# Patient Record
Sex: Male | Born: 1970 | Race: White | Hispanic: No | Marital: Married | Smoking: Former smoker
Health system: Southern US, Community
[De-identification: ages and names within clinical notes are randomized; demographics above are authoritative.]

## PROBLEM LIST (undated history)

## (undated) DIAGNOSIS — K219 Gastro-esophageal reflux disease without esophagitis: Secondary | ICD-10-CM

## (undated) DIAGNOSIS — I10 Essential (primary) hypertension: Secondary | ICD-10-CM

## (undated) DIAGNOSIS — E785 Hyperlipidemia, unspecified: Secondary | ICD-10-CM

## (undated) DIAGNOSIS — R739 Hyperglycemia, unspecified: Secondary | ICD-10-CM

## (undated) DIAGNOSIS — T8859XA Other complications of anesthesia, initial encounter: Secondary | ICD-10-CM

## (undated) DIAGNOSIS — G4730 Sleep apnea, unspecified: Secondary | ICD-10-CM

## (undated) DIAGNOSIS — J301 Allergic rhinitis due to pollen: Secondary | ICD-10-CM

## (undated) HISTORY — PX: REFRACTIVE SURGERY: SHX103

## (undated) HISTORY — PX: WRIST SURGERY: SHX841

## (undated) HISTORY — DX: Allergic rhinitis due to pollen: J30.1

## (undated) HISTORY — PX: EYE SURGERY: SHX253

## (undated) HISTORY — DX: Hyperglycemia, unspecified: R73.9

## (undated) HISTORY — DX: Essential (primary) hypertension: I10

## (undated) HISTORY — PX: HERNIA REPAIR: SHX51

## (undated) HISTORY — PX: NM RENAL LASIX (ARMC HX): HXRAD1213

## (undated) HISTORY — DX: Hyperlipidemia, unspecified: E78.5

## (undated) HISTORY — PX: TOE SURGERY: SHX1073

## (undated) HISTORY — PX: LASIK: SHX215

## (undated) HISTORY — PX: CATARACT EXTRACTION: SUR2

---

## 2007-05-26 ENCOUNTER — Ambulatory Visit: Payer: Self-pay | Admitting: Internal Medicine

## 2014-02-27 ENCOUNTER — Ambulatory Visit: Payer: Self-pay

## 2014-04-11 ENCOUNTER — Ambulatory Visit: Payer: Self-pay | Admitting: Ophthalmology

## 2014-04-11 DIAGNOSIS — I10 Essential (primary) hypertension: Secondary | ICD-10-CM

## 2014-04-11 LAB — POTASSIUM: Potassium: 3.7 mmol/L (ref 3.5–5.1)

## 2014-04-25 ENCOUNTER — Ambulatory Visit: Payer: Self-pay | Admitting: Ophthalmology

## 2014-11-15 ENCOUNTER — Encounter: Payer: Self-pay | Admitting: Podiatry

## 2014-11-15 ENCOUNTER — Ambulatory Visit (INDEPENDENT_AMBULATORY_CARE_PROVIDER_SITE_OTHER): Payer: BLUE CROSS/BLUE SHIELD

## 2014-11-15 ENCOUNTER — Ambulatory Visit (INDEPENDENT_AMBULATORY_CARE_PROVIDER_SITE_OTHER): Payer: BLUE CROSS/BLUE SHIELD | Admitting: Podiatry

## 2014-11-15 ENCOUNTER — Other Ambulatory Visit: Payer: Self-pay | Admitting: Podiatry

## 2014-11-15 VITALS — BP 113/73 | HR 61 | Resp 16 | Ht 68.0 in | Wt 210.0 lb

## 2014-11-15 DIAGNOSIS — M722 Plantar fascial fibromatosis: Secondary | ICD-10-CM

## 2014-11-15 DIAGNOSIS — M8430XA Stress fracture, unspecified site, initial encounter for fracture: Secondary | ICD-10-CM | POA: Diagnosis not present

## 2014-11-15 NOTE — Patient Instructions (Signed)

## 2014-11-15 NOTE — Progress Notes (Signed)
   Subjective:    Patient ID: Mark Lucas, male    DOB: 01-22-1971, 44 y.o.   MRN: [id number] Pt states that his left heel has been hurting for 3/4 days and he has not try any treatment other than called and made an appt to see [facility].  HPI    Review of Systems  Musculoskeletal: Positive for gait problem.  All other systems reviewed and are negative.      Objective:   Physical Exam        Assessment & Plan:

## 2014-11-18 NOTE — Progress Notes (Signed)
Subjective:     Patient ID: Mark Lucas, male   DOB: 29-Dec-1970, 44 y.o.   MRN: [id number]  HPI patient presents with severe heel pain left and states that he cannot bear weight on his heel and it's been present for around 3 days. Does not remember specific injury   Review of Systems  All other systems reviewed and are negative.      Objective:   Physical Exam  Constitutional: He is oriented to person, place, and time.  Cardiovascular: Intact distal pulses.   Musculoskeletal: Normal range of motion.  Neurological: He is oriented to person, place, and time.  Skin: Skin is warm.  Nursing note and vitals reviewed.  neurovascular status intact muscle strength adequate with range of motion within normal limits. Patient's noted to have severe discomfort in the left heel mostly in the heel itself with mild to moderate pain also in the plantar heel. Patient states that it is to bear any weight currently on the heel     Assessment:     Probability for stress fracture of the left heel versus plantar fasciitis or other condition    Plan:     Reviewed condition and at this time placed into a air fracture walker to reduce all plantar pressure and advised him to wear this as much as possible for the next 4 weeks. Reviewed his x-rays and discussed that stress fracture the heel can take 12-16 weeks to heal and will have to keep monitoring. Reappoint one month or earlier if needed

## 2014-11-23 NOTE — Op Note (Signed)
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  PROCEDURE: Phacoemulsification with posterior chamber intraocular lens in the left eye, model SN60WF 19.5 diopter.  SURGEON: Lia HoppingNisha Pernella Ackerley, MD  INDICATIONS: This is a 44 year old male with a history of decreased vision in his left eye due to anterior polar cataract.  PROCEDURE: The risks and benefits of cataract surgery were discussed at length with the patient including bleeding, infection, retinal detachment, reoperation, diplopia, ptosis, loss of vision and loss of the eye. Informed consent was obtained. 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 It was encouraged that we still be allowed to use Betadine in the patient's eye prep prior to surgery, however, the patient refused any Betadine usage due to history of anaphylaxis. As such, the risks of proceeding with surgery without Betadine eye prep, including increased risk of infection were thoroughly discussed with the patient and the patient elected to proceed with surgery. On the day of surgery, he received several sets of preoperative medication in the left eye, including 0.5% tetracaine, 1% cyclopentolate, 10% phenylephrine, 0.5% ketorolac, 0.5% gatifloxacin, and 2% lidocaine. He was taken to the operating room and sedated by IV sedation. Topical tetracaine was placed in the eye. 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 The eye was rinsed with sterile saline. He was covered in sterile drapes leaving only the operative eye exposed. A Lieberman lid speculum was placed to provide exposure. Using 0.12 forceps and a sideport blade, a paracentesis was created. Then, a mixture of BSS  preservative-free lidocaine and epinephrine was injected into the anterior chamber. Next, a 2.4 mm keratome blade was used to create a 2-step full-thickness clear corneal incision temporally. The cystotome and Utrata forceps were used to create a continuous capsulorrhexis in the anterior lens capsule. BSS on a hydrodissection cannula was used to perform gentle hydrodissection. Phacoemulsification was then performed to remove the nucleus. Irrigation and aspiration was performed to remove the remaining cortical material. Provisc was injected to fill the capsular bag and anterior chamber. A 19.5 diopter SN60WF intraocular lens was injected into the capsular bag. The Conner wand was used to rotate it into the proper position in the capsular bag. Irrigation and aspiration was performed to remove the remaining viscoelastic material from the eye. BSS on a 30 gauge cannula was used to hydrate the wound. Intracameral antibiotics were administered. The wounds were checked and found to be water tight. The lid speculum and drapes were carefully removed. Several drops of Vigamox were placed in the operative eye. The eye was covered with protective eyewear. The patient was taken to the recovery area in good condition. There were no complications.    ____________________________ Lia HoppingNisha Tavarion Babington, MD nm:TT D: 04/25/2014 13:34:37 ET T: 04/25/2014 14:51:40 ET JOB#: [id number]  cc: Lia HoppingNisha Suleika Donavan, MD, <Dictator> Lia HoppingNISHA Ambera Fedele MD ELECTRONICALLY SIGNED 04/29/2014 13:53

## 2015-03-10 ENCOUNTER — Encounter: Payer: Self-pay | Admitting: Emergency Medicine

## 2015-03-10 ENCOUNTER — Ambulatory Visit
Admission: EM | Admit: 2015-03-10 | Discharge: 2015-03-10 | Disposition: A | Discharge status: Home / Self Care | Payer: BLUE CROSS/BLUE SHIELD | Attending: Family Medicine | Admitting: Family Medicine

## 2015-03-10 DIAGNOSIS — Z79899 Other long term (current) drug therapy: Secondary | ICD-10-CM | POA: Diagnosis not present

## 2015-03-10 DIAGNOSIS — E785 Hyperlipidemia, unspecified: Secondary | ICD-10-CM | POA: Diagnosis not present

## 2015-03-10 DIAGNOSIS — I10 Essential (primary) hypertension: Secondary | ICD-10-CM | POA: Insufficient documentation

## 2015-03-10 DIAGNOSIS — K219 Gastro-esophageal reflux disease without esophagitis: Secondary | ICD-10-CM | POA: Insufficient documentation

## 2015-03-10 DIAGNOSIS — Z87891 Personal history of nicotine dependence: Secondary | ICD-10-CM | POA: Insufficient documentation

## 2015-03-10 DIAGNOSIS — R197 Diarrhea, unspecified: Secondary | ICD-10-CM | POA: Diagnosis not present

## 2015-03-10 HISTORY — DX: Gastro-esophageal reflux disease without esophagitis: K21.9

## 2015-03-10 HISTORY — DX: Essential (primary) hypertension: I10

## 2015-03-10 HISTORY — DX: Hyperlipidemia, unspecified: E78.5

## 2015-03-10 LAB — CBC WITH DIFFERENTIAL/PLATELET
Basophils Absolute: 0 10*3/uL (ref 0–0.1)
Basophils Relative: 1 %
Eosinophils Absolute: 0.2 10*3/uL (ref 0–0.7)
Eosinophils Relative: 2 %
HEMATOCRIT: 45.9 % (ref 40.0–52.0)
Hemoglobin: 15.7 g/dL (ref 13.0–18.0)
LYMPHS ABS: 1.5 10*3/uL (ref 1.0–3.6)
Lymphocytes Relative: 16 %
MCH: 30.6 pg (ref 26.0–34.0)
MCHC: 34.3 g/dL (ref 32.0–36.0)
MCV: 89.2 fL (ref 80.0–100.0)
MONOS PCT: 11 %
Monocytes Absolute: 1 10*3/uL (ref 0.2–1.0)
NEUTROS PCT: 70 %
Neutro Abs: 6.7 10*3/uL — ABNORMAL HIGH (ref 1.4–6.5)
Platelets: 420 10*3/uL (ref 150–440)
RBC: 5.14 MIL/uL (ref 4.40–5.90)
RDW: 13.3 % (ref 11.5–14.5)
WBC: 9.4 10*3/uL (ref 3.8–10.6)

## 2015-03-10 LAB — COMPREHENSIVE METABOLIC PANEL
ALK PHOS: 97 U/L (ref 38–126)
ALT: 22 U/L (ref 17–63)
AST: 18 U/L (ref 15–41)
Albumin: 4.5 g/dL (ref 3.5–5.0)
Anion gap: 7 (ref 5–15)
BUN: 17 mg/dL (ref 6–20)
CO2: 30 mmol/L (ref 22–32)
CREATININE: 0.96 mg/dL (ref 0.61–1.24)
Calcium: 9.4 mg/dL (ref 8.9–10.3)
Chloride: 101 mmol/L (ref 101–111)
GFR calc Af Amer: >60 mL/min (ref >60)
GFR calc non Af Amer: >60 mL/min (ref >60)
GLUCOSE: 114 mg/dL — AB (ref 65–99)
Potassium: 3.6 mmol/L (ref 3.5–5.1)
Sodium: 138 mmol/L (ref 135–145)
Total Bilirubin: 0.4 mg/dL (ref 0.3–1.2)
Total Protein: 8.4 g/dL — ABNORMAL HIGH (ref 6.5–8.1)

## 2015-03-10 NOTE — ED Notes (Signed)
Diarrhea on and off for 1 month

## 2015-03-10 NOTE — ED Provider Notes (Signed)
CSN: [id number]     Arrival date & time 03/10/15  1645 History   First MD Initiated Contact with Patient 03/10/15 1707     Chief Complaint  Patient presents with  . Diarrhea   (Consider location/radiation/quality/duration/timing/severity/associated sxs/prior Treatment) HPI Comments: 44 yo male with a 3 days h/o watery diarrhea. Patient states has had watery diarrhea for the past 1-2 months but only occurs on the weekends. States this episode started Friday night (3 days ago), associated with mild abdominal cramping.  Denies fevers, chills, vomiting, hematochezia, melena, recent travel, sick contacts, new medications.   Patient is a 44 y.o. male presenting with diarrhea. The history is provided by the patient.  Diarrhea   Past Medical History  Diagnosis Date  . Hypertension   . Hyperlipemia   . GERD (gastroesophageal reflux disease)    Past Surgical History  Procedure Laterality Date  . Lasik    . Cataract extraction     No family history on file. History  Substance Use Topics  . Smoking status: Former Games developer  . Smokeless tobacco: Former Neurosurgeon  . Alcohol Use: 0.0 oz/week    0 Standard drinks or equivalent per week    Review of Systems  Gastrointestinal: Positive for diarrhea.    Allergies  Iodinated diagnostic agents  Home Medications   Prior to Admission medications   Medication Sig Start Date End Date Taking? Authorizing Provider  bisoprolol-hydrochlorothiazide (ZIAC) 2.5-6.25 MG per tablet Take 1 tablet by mouth daily.   Yes Historical Provider, MD  cimetidine (TAGAMET) 200 MG tablet Take 200 mg by mouth 2 (two) times daily.   Yes Historical Provider, MD  fexofenadine (ALLEGRA) 180 MG tablet Take 180 mg by mouth daily.   Yes Historical Provider, MD  lisinopril (PRINIVIL,ZESTRIL) 20 MG tablet Take 20 mg by mouth daily.   Yes Historical Provider, MD  lovastatin (MEVACOR) 20 MG tablet Take 20 mg by mouth at bedtime.   Yes Historical Provider, MD  omeprazole (PRILOSEC)  20 MG capsule Take 20 mg by mouth daily.   Yes Historical Provider, MD   BP 100/65 mmHg  Pulse 70  Temp(Src) 97.8 F (36.6 C) (Tympanic)  Resp 18  Ht  (1.727 m)  Wt 215 lb (97.523 kg)  BMI 32.70 kg/m2  SpO2 97% Physical Exam  Constitutional: He appears well-developed and well-nourished. No distress.  Cardiovascular: Normal rate, regular rhythm and normal heart sounds.   Pulmonary/Chest: Effort normal and breath sounds normal. No respiratory distress. He has no wheezes. He has no rales.  Abdominal: Soft. Bowel sounds are normal. He exhibits no distension and no mass. There is no tenderness. There is no rebound and no guarding.  Neurological: He is alert.  Skin: No rash noted. He is not diaphoretic.  Nursing note and vitals reviewed.   ED Course  Procedures (including critical care time) Labs Review Labs Reviewed  CBC WITH DIFFERENTIAL/PLATELET - Abnormal; Notable for the following:    Neutro Abs 6.7 (*)    All other components within normal limits  COMPREHENSIVE METABOLIC PANEL - Abnormal; Notable for the following:    Glucose, Bld 114 (*)    Total Protein 8.4 (*)    All other components within normal limits  STOOL CULTURE  OVA AND PARASITE EXAMINATION  C DIFFICILE QUICK SCAN W PCR REFLEX    Imaging Review No results found.   MDM   1. Diarrhea   (unknown etiology) Plan: 1. Test results (negative/normal CBC, CMP)  and possible etiologies reviewed with patient  2. Check stool studies; patient unable to go in clinic; given sample containers 3. Recommend supportive treatment with clear liquid diet then advance slowly as tolerated; otc probiotics, imodium prn 4. F/u prn if symptoms worsen or don't improve    Payton Mccallum, MD 03/10/15 [phone]

## 2015-03-11 ENCOUNTER — Telehealth: Payer: Self-pay | Admitting: Family Medicine

## 2015-03-11 LAB — C DIFFICILE QUICK SCREEN W PCR REFLEX
C DIFFICILE (CDIFF) INTERP: NEGATIVE
C Diff antigen: NEGATIVE
C Diff toxin: NEGATIVE

## 2015-03-14 LAB — STOOL CULTURE: Special Requests: NORMAL

## 2015-03-27 ENCOUNTER — Telehealth: Payer: Self-pay | Admitting: Family Medicine

## 2015-03-27 LAB — OVA AND PARASITE EXAMINATION: Special Requests: NORMAL

## 2015-03-27 NOTE — Telephone Encounter (Signed)
Pt's wife called concerned because pt's bp is registering at 190/116. Please call ASAP. Thanks

## 2015-03-27 NOTE — Telephone Encounter (Signed)
I spoke back with his wife, his BP was still high at lunch time. She states he refuses to go to the ER. She states that he is still at work, but there is a Conservation officer, historic buildings there that is keeping a check on him. I asked her to please try to at least get him to go to Urgent Care and get started on meds today. She said she would attempt to, but he's "stubborn". We did go ahead and scheduled him an appointment with Dr. Sanda Klein for Tuesday.

## 2015-03-27 NOTE — Telephone Encounter (Signed)
FYI, Pt's PP number is [id number]. Last seen in Jan.

## 2015-03-27 NOTE — Telephone Encounter (Signed)
Patient had a metabolic test done at wife's work yesterday. It was super high there, and she checked it at home and it was 190/116. She tried telling him he needed to go to the ER but he was refusing. He says he has no headache, chest pain, SOB, dizziness. She was going to recheck it now and I advised her if it is still very high to take him to the ER.  She also wanted to know that if he still refuses to go to the ER, she had read if he drank a lot of water and took a diuretic that would drop it. Is that true? And/or safe to do?

## 2015-03-27 NOTE — Telephone Encounter (Signed)
I reviewed Programme researcher, broadcasting/film/video; I saw him January 28th, started him on BP medicine and he was supposed to return for f/u 2-3 weeks later I am going to have to advise him to go to urgent care today and then schedule an appt to see me in the next week We need to see him regularly to be able to help him and get his pressure controlled

## 2015-03-29 ENCOUNTER — Other Ambulatory Visit: Payer: Self-pay

## 2015-03-29 ENCOUNTER — Encounter: Payer: Self-pay | Admitting: Emergency Medicine

## 2015-03-29 ENCOUNTER — Emergency Department
Admission: EM | Admit: 2015-03-29 | Discharge: 2015-03-29 | Disposition: A | Discharge status: Home / Self Care | Payer: 59 | Attending: Emergency Medicine | Admitting: Emergency Medicine

## 2015-03-29 DIAGNOSIS — Z79899 Other long term (current) drug therapy: Secondary | ICD-10-CM | POA: Insufficient documentation

## 2015-03-29 DIAGNOSIS — I10 Essential (primary) hypertension: Secondary | ICD-10-CM | POA: Insufficient documentation

## 2015-03-29 MED ORDER — HYDROCHLOROTHIAZIDE 25 MG PO TABS
25.0000 mg | ORAL_TABLET | Freq: Every day | ORAL | Status: DC
  Administered 2015-03-29: 25 mg via ORAL
  Filled 2015-03-29: qty 1

## 2015-03-29 MED ORDER — HYDROCHLOROTHIAZIDE 25 MG PO TABS: 25.0000 mg | ORAL_TABLET | Freq: Every day | ORAL | Status: DC

## 2015-03-29 NOTE — ED Provider Notes (Signed)
[facility] Emergency Department Provider Note  Time seen: 2:38 PM  I have reviewed the triage vital signs and the nursing notes.   HISTORY  Chief Complaint Hypertension    HPI Andrew Bond is a 44 y.o. male with no past medical history presents the emergency department for an elevated blood pressure. According to the patient and his wife, they had a life insurance assessment, and his blood pressure was consistently elevated in the 180s. She checked it later that day and it was in the 190s, they checked it again today and was in the 170s so they came to the emergency department for evaluation. Patient denies any symptoms, denies any chest pain, headache, focal weakness or numbness. Patient states he only came because his wife wanted him to get evaluated.     History reviewed. No pertinent past medical history.  There are no active problems to display for this patient.   Past Surgical History  Procedure Laterality Date  . Refractive surgery    . Toe surgery    . Wrist surgery    . Hernia repair      x6    Current Outpatient Rx  Name  Route  Sig  Dispense  Refill  . loratadine (CLARITIN) 10 MG tablet   Oral   Take 10 mg by mouth daily.           Allergies Codeine  No family history on file.  Social History Social History  Substance Use Topics  . Smoking status: Never Smoker   . Smokeless tobacco: None  . Alcohol Use: Yes     Comment: occasionally    Review of Systems Constitutional: Negative for fever Cardiovascular: Negative for chest pain. Respiratory: Negative for shortness of breath. Gastrointestinal: Negative for abdominal pain Neurological: Negative for headache 10-point ROS otherwise negative.  ____________________________________________   PHYSICAL EXAM:  VITAL SIGNS: ED Triage Vitals  Enc Vitals Group     BP 03/29/15 1349 178/107 mmHg     Pulse Rate 03/29/15 1349 85     Resp 03/29/15 1349 20   Temp 03/29/15 1349 98.2 F (36.8 C)     Temp Source 03/29/15 1349 Oral     SpO2 03/29/15 1349 99 %     Weight 03/29/15 1349 180 lb (81.647 kg)     Height 03/29/15 1349 5\' 8"  (1.727 m)     Head Cir --      Peak Flow --      Pain Score 03/29/15 1350 0     Pain Loc --      Pain Edu? --      Excl. in [place]? --     Constitutional: Alert and oriented. Well appearing and in no distress. Eyes: Normal exam ENT   Head: Normocephalic and atraumatic. Cardiovascular: Normal rate, regular rhythm. No murmurs, rubs, or gallops. Respiratory: Normal respiratory effort without tachypnea nor retractions. Breath sounds are clear and equal bilaterally. No wheezes/rales/rhonchi. Gastrointestinal: Soft and nontender. No distention Musculoskeletal: Nontender with normal range of motion in all extremities Neurologic:  Normal speech and language. No gross focal neurologic deficits  Skin:  Skin is warm, dry and intact.  Psychiatric: Mood and affect are normal. Speech and behavior are normal.  ____________________________________________    EKG  EKG reviewed and interpreted by myself shows normal sinus rhythm at 79 bpm, narrow QRS, normal axis, normal intervals, no ST changes present. Overall normal EKG.  ____________________________________________   INITIAL IMPRESSION / ASSESSMENT AND PLAN / ED  COURSE  Pertinent labs & imaging results that were available during my care of the patient were reviewed by me and considered in my medical decision making (see chart for details).  Patient with elevated blood pressure 178/107 in the emergency department. Patient is otherwise asymptomatic. Patient states at one time several years ago he was prescribed a blood pressure medication, but states that blood pressure returned to normal and he stopped taking the pills. We will start the patient on hydrochlorothiazide, he has a follow-up appointment this Tuesday with a primary care doctor. Discussed return precautions for  chest pain, headache, focal weakness or numbness, patient agreeable. We'll discharge home at this time.  ____________________________________________   FINAL CLINICAL IMPRESSION(S) / ED DIAGNOSES  Hypertension   Harvest Dark, MD 03/29/15 1451

## 2015-03-29 NOTE — Discharge Instructions (Signed)

## 2015-03-29 NOTE — ED Notes (Signed)
Patient presents to the ED with hypertension for several days.  Patient had a health assessment for insurance and patient's systolic bp was in the 416L.  Patient denies dizziness, weakness, or headache.  Patient has been checking his bp daily since his assessment and it has been high, 845X-646O diastolic.  Patient has a pcp appointment Tuesday.

## 2015-03-31 DIAGNOSIS — R030 Elevated blood-pressure reading, without diagnosis of hypertension: Secondary | ICD-10-CM

## 2015-03-31 DIAGNOSIS — IMO0001 Reserved for inherently not codable concepts without codable children: Secondary | ICD-10-CM | POA: Insufficient documentation

## 2015-03-31 DIAGNOSIS — Z720 Tobacco use: Secondary | ICD-10-CM | POA: Insufficient documentation

## 2015-03-31 LAB — OVA + PARASITE EXAM

## 2015-04-01 ENCOUNTER — Ambulatory Visit (INDEPENDENT_AMBULATORY_CARE_PROVIDER_SITE_OTHER): Payer: 59 | Admitting: Family Medicine

## 2015-04-01 ENCOUNTER — Encounter: Payer: Self-pay | Admitting: Family Medicine

## 2015-04-01 VITALS — BP 158/108 | HR 79 | Temp 97.6°F | Ht 67.0 in | Wt 187.0 lb

## 2015-04-01 DIAGNOSIS — I10 Essential (primary) hypertension: Secondary | ICD-10-CM | POA: Diagnosis not present

## 2015-04-01 DIAGNOSIS — J301 Allergic rhinitis due to pollen: Secondary | ICD-10-CM | POA: Diagnosis not present

## 2015-04-01 DIAGNOSIS — Z0000 Encounter for general adult medical examination without abnormal findings: Secondary | ICD-10-CM

## 2015-04-01 HISTORY — DX: Allergic rhinitis due to pollen: J30.1

## 2015-04-01 HISTORY — DX: Essential (primary) hypertension: I10

## 2015-04-01 MED ORDER — AMLODIPINE BESYLATE 2.5 MG PO TABS
2.5000 mg | ORAL_TABLET | Freq: Every day | ORAL | Status: DC
Start: 2015-04-01 — End: 2015-04-22

## 2015-04-01 NOTE — Assessment & Plan Note (Signed)
I wanted to get lipids and glucose today but patient says he just had full panel done through [facility] last week, will bring those in today; declined lab draw today

## 2015-04-01 NOTE — Progress Notes (Signed)
BP 158/108 mmHg  Pulse 79  Temp(Src) 97.6 F (36.4 C)  Ht 5\' 7"  (1.702 m)  Wt 187 lb (84.823 kg)  BMI 29.28 kg/m2  SpO2 97%   Subjective:    Patient ID: Andrew Bond, male    DOB: 12/10/1970, 44 y.o.   MRN: [id number]  HPI: Andrew Bond is a 44 y.o. male  Chief Complaint  Patient presents with  . Hypertension   He went to urgent care one day last week and it was back down so they let him go without any medicine, 140/90 or so The next day, went up and went to the ER; they put him on HCTZ; he has not been checking BP at home much 120/90 at home; always been 80s and 90s Under fair amount of stress at work; nonsmoker; no decongestants; adds a little salt, but not much; eats sandwich at the house, not much fast food  Relevant past medical, surgical, family and social history reviewed and updated as indicated. Mother did have HTN, brother has HTN (twin). Interim medical history since our last visit reviewed. Allergies and medications reviewed and updated.  Review of Systems  Constitutional: Negative for unexpected weight change.  Respiratory: Negative for shortness of breath.   Cardiovascular: Negative for chest pain, palpitations and leg swelling.  Gastrointestinal: Negative for blood in stool.  Genitourinary: Negative for hematuria.  Per HPI unless specifically indicated above     Objective:    BP 158/108 mmHg  Pulse 79  Temp(Src) 97.6 F (36.4 C)  Ht 5\' 7"  (1.702 m)  Wt 187 lb (84.823 kg)  BMI 29.28 kg/m2  SpO2 97%  Wt Readings from Last 3 Encounters:  04/01/15 187 lb (84.823 kg)  08/29/14 187 lb (84.823 kg)  03/29/15 180 lb (81.647 kg)    Physical Exam  Constitutional: He appears well-developed and well-nourished. No distress.  HENT:  Head: Normocephalic and atraumatic.  Eyes: EOM are normal. No scleral icterus.  Neck: No thyromegaly present.  Cardiovascular: Normal rate and regular rhythm.   Pulmonary/Chest: Effort normal and  breath sounds normal.  Abdominal: Soft. Bowel sounds are normal. He exhibits no distension.  Musculoskeletal: He exhibits no edema.  Neurological: Coordination normal.  Skin: Skin is warm and dry. No pallor.  Psychiatric: He has a normal mood and affect. His behavior is normal. Judgment and thought content normal.   No results found for this or any previous visit.    Assessment & Plan:   Problem List Items Addressed This Visit      Cardiovascular and Mediastinum   Essential hypertension, benign - Primary    DASH guidelines encouraged; avoid decongestants; discussed stroke risk; continue HCTZ; add CCB; he just had labs done a week ago and will bring those results to me; explained role of kidneys in BP control; monitor BP at home; decrease sodium; nonsmoker; limit stress; return in 3 weeks and get BMP at that visit      Relevant Medications   amLODipine (NORVASC) 2.5 MG tablet     Respiratory   Allergic rhinitis due to pollen    Avoid decongestants with HTN; okay to plain antihistamines        Other   Preventative health care    I wanted to get lipids and glucose today but patient says he just had full panel done through [facility] last week, will bring those in today; declined lab draw today         Follow up plan: Return  in about 3 weeks (around 04/22/2015) for blood pressure.  An after-visit summary was printed and given to the patient at [facility].  Please see the patient instructions which may contain other information and recommendations beyond what is mentioned above in the assessment and plan. Face-to-face time with patient was more than 25 minutes, >50% time spent counseling and coordination of care Meds ordered this encounter  Medications  . amLODipine (NORVASC) 2.5 MG tablet    Sig: Take 1 tablet (2.5 mg total) by mouth daily.    Dispense:  30 tablet    Refill:  6

## 2015-04-01 NOTE — Patient Instructions (Addendum)
Try to use PLAIN allergy medicine without the decongestant Avoid: phenylephrine, phenylpropanolamine, and pseudoephredine Start the new blood pressure amlodipine in addition to the HCTZ Please do get those lab results to me in the next week Return in 3 weeks for blood pressure and BMP lab If you notice leg cramps or heart palpitations in the meantime not helped by a banana or glass of orange juice, then call and come in sooner   DASH Eating Plan DASH stands for "Dietary Approaches to Stop Hypertension." The DASH eating plan is a healthy eating plan that has been shown to reduce high blood pressure (hypertension). Additional health benefits may include reducing the risk of type 2 diabetes mellitus, heart disease, and stroke. The DASH eating plan may also help with weight loss. WHAT DO I NEED TO KNOW ABOUT THE DASH EATING PLAN? For the DASH eating plan, you will follow these general guidelines:  Choose foods with a percent daily value for sodium of less than 5% (as listed on the food label).  Use salt-free seasonings or herbs instead of table salt or sea salt.  Check with your health care provider or pharmacist before using salt substitutes.  Eat lower-sodium products, often labeled as "lower sodium" or "no salt added."  Eat fresh foods.  Eat more vegetables, fruits, and low-fat dairy products.  Choose whole grains. Look for the word "whole" as the first word in the ingredient list.  Choose fish and skinless chicken or [place] more often than red meat. Limit fish, poultry, and meat to 6 oz (170 g) each day.  Limit sweets, desserts, sugars, and sugary drinks.  Choose heart-healthy fats.  Limit cheese to 1 oz (28 g) per day.  Eat more home-cooked food and less restaurant, buffet, and fast food.  Limit fried foods.  Cook foods using methods other than frying.  Limit canned vegetables. If you do use them, rinse them well to decrease the sodium.  When eating at a restaurant, ask  that your food be prepared with less salt, or no salt if possible. WHAT FOODS CAN I EAT? Seek help from a dietitian for individual calorie needs. Grains Whole grain or whole wheat bread. Brown rice. Whole grain or whole wheat pasta. Quinoa, bulgur, and whole grain cereals. Low-sodium cereals. Corn or whole wheat flour tortillas. Whole grain cornbread. Whole grain crackers. Low-sodium crackers. Vegetables Fresh or frozen vegetables (raw, steamed, roasted, or grilled). Low-sodium or reduced-sodium tomato and vegetable juices. Low-sodium or reduced-sodium tomato sauce and paste. Low-sodium or reduced-sodium canned vegetables.  Fruits All fresh, canned (in natural juice), or frozen fruits. Meat and Other Protein Products Ground beef (85% or leaner), grass-fed beef, or beef trimmed of fat. Skinless chicken or [place]. Ground chicken or [place]. Pork trimmed of fat. All fish and seafood. Eggs. Dried beans, peas, or lentils. Unsalted nuts and seeds. Unsalted canned beans. Dairy Low-fat dairy products, such as skim or 1% milk, 2% or reduced-fat cheeses, low-fat ricotta or cottage cheese, or plain low-fat yogurt. Low-sodium or reduced-sodium cheeses. Fats and Oils Tub margarines without trans fats. Light or reduced-fat mayonnaise and salad dressings (reduced sodium). Avocado. Safflower, olive, or canola oils. Natural peanut or almond butter. Other Unsalted popcorn and pretzels. The items listed above may not be a complete list of recommended foods or beverages. Contact your dietitian for more options. WHAT FOODS ARE NOT RECOMMENDED? Grains White bread. White pasta. White rice. Refined cornbread. Bagels and croissants. Crackers that contain trans fat. Vegetables Creamed or fried vegetables. Vegetables in a  cheese sauce. Regular canned vegetables. Regular canned tomato sauce and paste. Regular tomato and vegetable juices. Fruits Dried fruits. Canned fruit in light or heavy syrup. Fruit juice. Meat and  Other Protein Products Fatty cuts of meat. Ribs, chicken wings, bacon, sausage, bologna, salami, chitterlings, fatback, hot dogs, bratwurst, and packaged luncheon meats. Salted nuts and seeds. Canned beans with salt. Dairy Whole or 2% milk, cream, half-and-half, and cream cheese. Whole-fat or sweetened yogurt. Full-fat cheeses or blue cheese. Nondairy creamers and whipped toppings. Processed cheese, cheese spreads, or cheese curds. Condiments Onion and garlic salt, seasoned salt, table salt, and sea salt. Canned and packaged gravies. Worcestershire sauce. Tartar sauce. Barbecue sauce. Teriyaki sauce. Soy sauce, including reduced sodium. Steak sauce. Fish sauce. Oyster sauce. Cocktail sauce. Horseradish. Ketchup and mustard. Meat flavorings and tenderizers. Bouillon cubes. Hot sauce. Tabasco sauce. Marinades. Taco seasonings. Relishes. Fats and Oils Butter, stick margarine, lard, shortening, ghee, and bacon fat. Coconut, palm kernel, or palm oils. Regular salad dressings. Other Pickles and olives. Salted popcorn and pretzels. The items listed above may not be a complete list of foods and beverages to avoid. Contact your dietitian for more information. WHERE CAN I FIND MORE INFORMATION? National Heart, Lung, and Blood Institute: travelstabloid.com Document Released: 07/08/2011 Document Revised: 12/03/2013 Document Reviewed: 05/23/2013 [facility] Patient Information 2015 [place], [place]. This information is not intended to replace advice given to you by your health care provider. Make sure you discuss any questions you have with your health care provider.

## 2015-04-01 NOTE — Assessment & Plan Note (Signed)
Avoid decongestants with HTN; okay to plain antihistamines

## 2015-04-01 NOTE — Assessment & Plan Note (Addendum)
DASH guidelines encouraged; avoid decongestants; discussed stroke risk; continue HCTZ; add CCB; he just had labs done a week ago and will bring those results to me; explained role of kidneys in BP control; monitor BP at home; decrease sodium; nonsmoker; limit stress; return in 3 weeks and get BMP at that visit

## 2015-04-14 ENCOUNTER — Encounter: Payer: Self-pay | Admitting: Family Medicine

## 2015-04-22 ENCOUNTER — Ambulatory Visit (INDEPENDENT_AMBULATORY_CARE_PROVIDER_SITE_OTHER): Payer: 59 | Admitting: Family Medicine

## 2015-04-22 ENCOUNTER — Encounter: Payer: Self-pay | Admitting: Family Medicine

## 2015-04-22 VITALS — BP 146/93 | HR 67 | Temp 97.2°F | Wt 189.0 lb

## 2015-04-22 DIAGNOSIS — I10 Essential (primary) hypertension: Secondary | ICD-10-CM | POA: Diagnosis not present

## 2015-04-22 DIAGNOSIS — Z5181 Encounter for therapeutic drug level monitoring: Secondary | ICD-10-CM | POA: Insufficient documentation

## 2015-04-22 DIAGNOSIS — E785 Hyperlipidemia, unspecified: Secondary | ICD-10-CM

## 2015-04-22 HISTORY — DX: Hyperlipidemia, unspecified: E78.5

## 2015-04-22 MED ORDER — HYDROCHLOROTHIAZIDE 25 MG PO TABS: 25.0000 mg | ORAL_TABLET | Freq: Every day | ORAL | Status: DC

## 2015-04-22 MED ORDER — AMLODIPINE BESYLATE 5 MG PO TABS: 5.0000 mg | ORAL_TABLET | Freq: Every day | ORAL | Status: DC

## 2015-04-22 NOTE — Progress Notes (Signed)
BP 146/93 mmHg  Pulse 67  Temp(Src) 97.2 F (36.2 C)  Wt 189 lb (85.73 kg)  SpO2 97%   Subjective:    Patient ID: Andrew Bond, male    DOB: 1970/12/16, 44 y.o.   MRN: [id number]  HPI: Andrew Bond is a 44 y.o. male  Chief Complaint  Patient presents with  . Hypertension   He brought the BP monitor using at home and his runs higher than ours His numbers at home are ranging in the 35D diastolic  Trying to limit salt intake No side effects; no constipation, no puffiness around the ankles  He is trying to get motivated to exercise again; time is a struggle; he has equipment, that isn't a problem; by the time he gets home, he eats and then sits and relaxes  He had lipids done, sent to our office; LDL was elevated  Relevant past medical, surgical, family and social history reviewed and updated as indicated. Interim medical history since our last visit reviewed. Allergies and medications reviewed and updated.  Review of Systems Per HPI unless specifically indicated above     Objective:    BP 146/93 mmHg  Pulse 67  Temp(Src) 97.2 F (36.2 C)  Wt 189 lb (85.73 kg)  SpO2 97%  Wt Readings from Last 3 Encounters:  04/22/15 189 lb (85.73 kg)  04/01/15 187 lb (84.823 kg)  08/29/14 187 lb (84.823 kg)    Physical Exam  Constitutional: He appears well-developed and well-nourished.  Eyes: No scleral icterus.  Cardiovascular: Normal rate and regular rhythm.   Pulmonary/Chest: Effort normal and breath sounds normal.  Abdominal: He exhibits no distension.  Musculoskeletal: He exhibits no edema.  Skin: Skin is warm and dry.  Psychiatric: He has a normal mood and affect.    No results found for this or any previous visit.    Assessment & Plan:   Problem List Items Addressed This Visit      Cardiovascular and Mediastinum   Essential hypertension, benign - Primary    Better but not quite to goal, increase CCB from 2.5 mg to 5 mg and continue  the HCTZ; work on modest weight loss; DASH guidelines; call me in 2-3 weeks with numbers      Relevant Medications   amLODipine (NORVASC) 5 MG tablet   hydrochlorothiazide (HYDRODIURIL) 25 MG tablet   Other Relevant Orders   Basic Metabolic Panel (BMET)     Other   Medication monitoring encounter    Check BMP on thiazide diuretic      Relevant Orders   Basic Metabolic Panel (BMET)   Dyslipidemia    Limit eggs, saturated fats; increase fiber; modest weight loss; recheck fasting lipids at f/u in 5-6 months         Follow up plan: Return in about 5 years (around 04/21/2020) for fasting labs, follow-up appt.   Medications Discontinued During This Encounter  Medication Reason  . amLODipine (NORVASC) 2.5 MG tablet Dose change  . hydrochlorothiazide (HYDRODIURIL) 25 MG tablet Reorder    Meds ordered this encounter  Medications  . amLODipine (NORVASC) 5 MG tablet    Sig: Take 1 tablet (5 mg total) by mouth daily.    Dispense:  30 tablet    Refill:  6  . hydrochlorothiazide (HYDRODIURIL) 25 MG tablet    Sig: Take 1 tablet (25 mg total) by mouth daily.    Dispense:  30 tablet    Refill:  6   Orders Placed  This Encounter  Procedures  . Basic Metabolic Panel (BMET)

## 2015-04-22 NOTE — Assessment & Plan Note (Signed)
Better but not quite to goal, increase CCB from 2.5 mg to 5 mg and continue the HCTZ; work on modest weight loss; DASH guidelines; call me in 2-3 weeks with numbers

## 2015-04-22 NOTE — Assessment & Plan Note (Signed)
Limit eggs, saturated fats; increase fiber; modest weight loss; recheck fasting lipids at f/u in 5-6 months

## 2015-04-22 NOTE — Patient Instructions (Addendum)
Try to follow the DASH guidelines We'll check just a basic metabolic panel today Increase the amlodipine from 2.5 mg daily to 5 mg daily Shop around for the best price Check out the information at familydoctor.org entitled "What It Takes to Lose Weight" Try to lose between 1-2 pounds per week by taking in fewer calories and burning off more calories You can succeed by limiting portions, limiting foods dense in calories and fat, becoming more active, and drinking 8 glasses of water a day Don't skip meals, especially breakfast, as skipping meals may alter your metabolism Do not use over-the-counter weight loss pills or gimmicks that claim rapid weight loss A healthy BMI (or body mass index) is between 18.5 and 24.9 You can calculate your ideal BMI at the [place] website ClubMonetize.fr Call me in 2-3 weeks with your BP readings Return in 5-6 months, come fasting and we'll check your cholesterol at that time time Limit eggs to no more than 3 per weeks Return for a flu vaccine in October, and avoid hospitals, nursing homes, and daycares during peak flu and cold season  Cholesterol Cholesterol is a white, waxy, fat-like substance needed by your body in small amounts. The liver makes all the cholesterol you need. Cholesterol is carried from the liver by the blood through the blood vessels. Deposits of cholesterol (plaque) may build up on blood vessel walls. These make the arteries narrower and stiffer. Cholesterol plaques increase the risk for heart attack and stroke.  You cannot feel your cholesterol level even if it is very high. The only way to know it is high is with a blood test. Once you know your cholesterol levels, you should keep a record of the test results. Work with your health care provider to keep your levels in the desired range.  WHAT DO THE RESULTS MEAN?  Total cholesterol is a rough measure of all the cholesterol in your blood.   LDL  is the so-called bad cholesterol. This is the type that deposits cholesterol in the walls of the arteries. You want this level to be low.   HDL is the good cholesterol because it cleans the arteries and carries the LDL away. You want this level to be high.  Triglycerides are fat that the body can either burn for energy or store. High levels are closely linked to heart disease.  WHAT ARE THE DESIRED LEVELS OF CHOLESTEROL?  Total cholesterol below 200.   LDL below 100 for people at risk, below 70 for those at very high risk.   HDL above 50 is good, above 60 is best.   Triglycerides below 150.  HOW CAN I LOWER MY CHOLESTEROL?  Diet. Follow your diet programs as directed by your health care provider.   Choose fish or white meat chicken and [place], roasted or baked. Limit fatty cuts of red meat, fried foods, and processed meats, such as sausage and lunch meats.   Eat lots of fresh fruits and vegetables.  Choose whole grains, beans, pasta, potatoes, and cereals.   Use only small amounts of olive, corn, or canola oils.   Avoid butter, mayonnaise, shortening, or palm kernel oils.  Avoid foods with trans fats.   Drink skim or nonfat milk and eat low-fat or nonfat yogurt and cheeses. Avoid whole milk, cream, ice cream, egg yolks, and full-fat cheeses.   Healthy desserts include angel food cake, ginger snaps, animal crackers, hard candy, popsicles, and low-fat or nonfat frozen yogurt. Avoid pastries, cakes, pies, and cookies.  Exercise. Follow your exercise programs as directed by your health care provider.   A regular program helps decrease LDL and raise HDL.   A regular program helps with weight control.   Do things that increase your activity level like gardening, walking, or taking the stairs. Ask your health care provider about how you can be more active in your daily life.   Medicine. Take medicine only as directed by your health care provider.   Medicine may  be prescribed by your health care provider to help lower cholesterol and decrease the risk for heart disease.   If you have several risk factors, you may need medicine even if your levels are normal. Document Released: 04/13/2001 Document Revised: 12/03/2013 Document Reviewed: 05/02/2013 [facility] Patient Information 2015 [place], [place]. This information is not intended to replace advice given to you by your health care provider. Make sure you discuss any questions you have with your health care provider.

## 2015-04-22 NOTE — Assessment & Plan Note (Signed)
Check BMP on thiazide diuretic

## 2015-04-23 ENCOUNTER — Encounter: Payer: Self-pay | Admitting: Family Medicine

## 2015-04-23 LAB — BASIC METABOLIC PANEL
BUN / CREAT RATIO: 18 (ref 9–20)
BUN: 15 mg/dL (ref 6–24)
CO2: 23 mmol/L (ref 18–29)
CREATININE: 0.85 mg/dL (ref 0.76–1.27)
Calcium: 9.3 mg/dL (ref 8.7–10.2)
Chloride: 101 mmol/L (ref 97–108)
GFR calc non Af Amer: 107 mL/min/{1.73_m2} (ref >59)
GFR, EST AFRICAN AMERICAN: 123 mL/min/{1.73_m2} (ref >59)
GLUCOSE: 102 mg/dL — AB (ref 65–99)
Potassium: 3.7 mmol/L (ref 3.5–5.2)
Sodium: 140 mmol/L (ref 134–144)

## 2015-04-29 LAB — O&P RESULT

## 2015-05-07 ENCOUNTER — Telehealth: Payer: Self-pay

## 2015-05-07 NOTE — Telephone Encounter (Signed)
-----   Message from Arnetha Courser, MD sent at 04/22/2015  8:51 AM EDT ----- Regarding: flu shot Remind him to come in for a flu shot in october

## 2015-05-07 NOTE — Telephone Encounter (Signed)
Patient notified

## 2015-06-02 ENCOUNTER — Other Ambulatory Visit: Payer: Self-pay | Admitting: Family Medicine

## 2015-06-02 MED ORDER — AMLODIPINE BESYLATE 5 MG PO TABS: 5.0000 mg | ORAL_TABLET | Freq: Every day | ORAL | Status: DC

## 2015-06-02 MED ORDER — HYDROCHLOROTHIAZIDE 25 MG PO TABS: 25.0000 mg | ORAL_TABLET | Freq: Every day | ORAL | Status: DC

## 2015-06-02 NOTE — Telephone Encounter (Signed)
He needs printed out rx's, going to a new pharmacy but patient doesn't know which one yet.

## 2015-06-02 NOTE — Telephone Encounter (Signed)
Pt needs new rx's due to the change of pharmacy. He will pick up hard copy

## 2015-06-02 NOTE — Telephone Encounter (Signed)
That's fine I called and left message to cancel his remaining refills at the [facility]

## 2015-07-21 NOTE — ED Notes (Signed)
Entered in error  Payton Mccallumrlando Kelden Lavallee, MD 07/21/15 1134

## 2015-09-24 ENCOUNTER — Encounter: Payer: Self-pay | Admitting: Family Medicine

## 2015-09-24 ENCOUNTER — Ambulatory Visit (INDEPENDENT_AMBULATORY_CARE_PROVIDER_SITE_OTHER): Payer: 59 | Admitting: Family Medicine

## 2015-09-24 ENCOUNTER — Ambulatory Visit
Admission: RE | Admit: 2015-09-24 | Discharge: 2015-09-24 | Disposition: A | Discharge status: Home / Self Care | Payer: 59 | Source: Ambulatory Visit | Attending: Family Medicine | Admitting: Family Medicine

## 2015-09-24 VITALS — BP 147/89 | HR 92 | Temp 98.1°F | Ht 67.5 in | Wt 183.8 lb

## 2015-09-24 DIAGNOSIS — Z0000 Encounter for general adult medical examination without abnormal findings: Secondary | ICD-10-CM

## 2015-09-24 DIAGNOSIS — I10 Essential (primary) hypertension: Secondary | ICD-10-CM | POA: Diagnosis not present

## 2015-09-24 DIAGNOSIS — R05 Cough: Secondary | ICD-10-CM

## 2015-09-24 DIAGNOSIS — R053 Chronic cough: Secondary | ICD-10-CM | POA: Insufficient documentation

## 2015-09-24 DIAGNOSIS — E785 Hyperlipidemia, unspecified: Secondary | ICD-10-CM | POA: Diagnosis not present

## 2015-09-24 DIAGNOSIS — Z5181 Encounter for therapeutic drug level monitoring: Secondary | ICD-10-CM

## 2015-09-24 LAB — CBC WITH DIFFERENTIAL/PLATELET
HEMATOCRIT: 42.7 % (ref 37.5–51.0)
Hemoglobin: 15.2 g/dL (ref 12.6–17.7)
LYMPHS ABS: 0.8 10*3/uL (ref 0.7–3.1)
LYMPHS: 15 %
MCH: 31.3 pg (ref 26.6–33.0)
MCHC: 35.6 g/dL (ref 31.5–35.7)
MCV: 88 fL (ref 79–97)
MID (Absolute): 0.6 10*3/uL (ref 0.1–1.6)
MID: 13 %
NEUTROS PCT: 72 %
Neutrophils Absolute: 3.6 10*3/uL (ref 1.4–7.0)
Platelets: 307 10*3/uL (ref 150–379)
RBC: 4.85 x10E6/uL (ref 4.14–5.80)
RDW: 13.5 % (ref 12.3–15.4)
WBC: 5 10*3/uL (ref 3.4–10.8)

## 2015-09-24 MED ORDER — LEVOFLOXACIN 500 MG PO TABS
500.0000 mg | ORAL_TABLET | Freq: Every day | ORAL | Status: DC
Start: 2015-09-24 — End: 2016-03-12

## 2015-09-24 MED ORDER — HYDROCOD POLST-CPM POLST ER 10-8 MG/5ML PO SUER: 5.0000 mL | Freq: Two times a day (BID) | ORAL | Status: DC | PRN

## 2015-09-24 MED ORDER — HYDROCHLOROTHIAZIDE 25 MG PO TABS: 25.0000 mg | ORAL_TABLET | Freq: Every day | ORAL | Status: DC

## 2015-09-24 MED ORDER — AMLODIPINE BESYLATE 5 MG PO TABS: 5.0000 mg | ORAL_TABLET | Freq: Every day | ORAL | Status: DC

## 2015-09-24 NOTE — Assessment & Plan Note (Addendum)
Check lipids today (not truly fasting, had cereal)

## 2015-09-24 NOTE — Assessment & Plan Note (Signed)
In-house CBC showed normal white count, but >70% segs (trending toward left shift); will test quantiferon gold and get CXR today; explained I would expect his oxygen level to be higher; close f/u in two days; start levaquin; cough syrup if needed; if rash, stop; do NOT mix with alcohol or other controlled substances, see AVS for cautions; if getting worse, go to urgent care or ER; explained he is likely infectious and contagious, advised he stay out of work, could get other people sick which could be serious; avoid exposures to nursing homes, daycares, hospitals as visitors; patient agrees with plan

## 2015-09-24 NOTE — Patient Instructions (Addendum)
Go from here to [facility] to get a chest xray  You are most likely contagious, so wear a mask when out in public, and stay away from hospitals, nursing homes, and daycares as a visitor  Use the Tussionex cough syrup as directed if you need it; do NOT use any pain pills, nerve pills, sleeping pills, or any other sort of controlled substance while taking this cough medicine; also, do NOT drink any alcohol while using this; combing alcohol or other substances with this cough medicine could result in accidental overdose and death  Try vitamin C (orange juice if not diabetic or vitamin C tablets) and drink green tea to help your immune system during your illness Get plenty of rest and hydration  Return for follow-up on Friday to recheck your oxygen and check your progress on the new medicine  If you get worse, please call or go to urgent care or the emergency department

## 2015-09-24 NOTE — Assessment & Plan Note (Signed)
Check creatinine 

## 2015-09-24 NOTE — Assessment & Plan Note (Signed)
Not to goal, but patient is sick today; avoid decongestants; continue antihypertensives; follow DASH guidelines; close f/u in two days; refills of meds provided; check creatinine and -lytes on thiazide

## 2015-09-24 NOTE — Progress Notes (Signed)
BP 147/89 mmHg  Pulse 92  Temp(Src) 98.1 F (36.7 C)  Ht 5' 7.5" (1.715 m)  Wt 183 lb 12.8 oz (83.371 kg)  BMI 28.35 kg/m2  SpO2 93%   Subjective:    Patient ID: Andrew Bond, male    DOB: [birth year], 45 y.o.   MRN: [id number]  HPI: Andrew Bond is a 45 y.o. male  Chief Complaint  Patient presents with  . Follow-up    For Hypertension. Patient says that this has been going on for 3 x's month. Gets better w/ antibiotics from walk-in clinic and sickness comes back.   . Nasal Congestion  . Vomiting    From mucous drainage  . Headache  . Cough   Patient and his wife are here, both sick today No coughing up blood, no weight loss, having night sweats; throwing off blankets Going on for 2-3 months; little bit of brown but mostly clear or cream color No swollen lymph nodes Having sore throat, irritated and raw from coughing and drainage Using nasal spray Coughing jags to the point of almost vomiting Pertussis appears to be out of date Patient went to urgent care 3 weeks ago, treated with doxycycline and it did not help Tessalon perles not working at all, has prescription cough medicine at home, tastes gross; codeine works best for patient He has not had any exposure to immigrant, prison, or homeless population He has not had a chest xray  He has high blood pressure; taking two meds  Relevant past medical, surgical, family and social history reviewed and updated as indicated Interim medical history since our last visit reviewed. Nonsmoker, but around smokers at work  Allergies and medications reviewed and updated.  Review of Systems Per HPI unless specifically indicated above     Objective:    BP 147/89 mmHg  Pulse 92  Temp(Src) 98.1 F (36.7 C)  Ht 5' 7.5" (1.715 m)  Wt 183 lb 12.8 oz (83.371 kg)  BMI 28.35 kg/m2  SpO2 93%  Wt Readings from Last 3 Encounters:  09/24/15 183 lb 12.8 oz (83.371 kg)  04/22/15 189 lb (85.73 kg)  04/01/15  187 lb (84.823 kg)    Physical Exam  Constitutional: He appears well-developed and well-nourished. No distress.  Weight loss of six pounds since last visit  HENT:  Right Ear: Hearing, external ear and ear canal normal. No drainage. Tympanic membrane is not erythematous and not retracted.  Left Ear: Hearing, external ear and ear canal normal. No drainage. Tympanic membrane is retracted. Tympanic membrane is not erythematous.  Nose: Mucosal edema present. Right sinus exhibits maxillary sinus tenderness and frontal sinus tenderness. Left sinus exhibits maxillary sinus tenderness and frontal sinus tenderness.  Mouth/Throat: Mucous membranes are not pale and not dry. Uvula swelling present. Posterior oropharyngeal erythema (mild) present. No oropharyngeal exudate.  Cardiovascular: Normal rate and regular rhythm.   Pulmonary/Chest: No accessory muscle usage. No respiratory distress. He has no decreased breath sounds. He has no wheezes. He has rhonchi (scattered). He has no rales.  Abdominal: He exhibits no distension.  Musculoskeletal: He exhibits no edema.  Lymphadenopathy:       Head (right side): No posterior auricular and no occipital adenopathy present.       Head (left side): No posterior auricular and no occipital adenopathy present.    He has no cervical adenopathy.       Right cervical: No posterior cervical adenopathy present.      Left cervical: No posterior cervical  adenopathy present.       Right: No supraclavicular adenopathy present.       Left: No supraclavicular adenopathy present.  Neurological: He is alert.  Skin: Skin is warm and dry. No rash noted. No pallor.  Psychiatric: He has a normal mood and affect.    Results for orders placed or performed in visit on 0000000  Basic Metabolic Panel (BMET)  Result Value Ref Range   Glucose 102 (H) 65 - 99 mg/dL   BUN 15 6 - 24 mg/dL   Creatinine, Ser 0.85 0.76 - 1.27 mg/dL   GFR calc non Af Amer 107 >59 mL/min/1.73   GFR calc  Af Amer 123 >59 mL/min/1.73   BUN/Creatinine Ratio 18 9 - 20   Sodium 140 134 - 144 mmol/L   Potassium 3.7 3.5 - 5.2 mmol/L   Chloride 101 97 - 108 mmol/L   CO2 23 18 - 29 mmol/L   Calcium 9.3 8.7 - 10.2 mg/dL      Assessment & Plan:   Problem List Items Addressed This Visit      Cardiovascular and Mediastinum   Essential hypertension, benign    Not to goal, but patient is sick today; avoid decongestants; continue antihypertensives; follow DASH guidelines; close f/u in two days; refills of meds provided; check creatinine and -lytes on thiazide      Relevant Medications   hydrochlorothiazide (HYDRODIURIL) 25 MG tablet   amLODipine (NORVASC) 5 MG tablet     Other   Preventative health care    Check lipids      Relevant Orders   Lipid Panel w/o Chol/HDL Ratio   Medication monitoring encounter    Check creatinine      Relevant Orders   Comprehensive metabolic panel   Dyslipidemia    Check lipids today (not truly fasting, had cereal)      Chronic cough - Primary    In-house CBC showed normal white count, but >70% segs (trending toward left shift); will test quantiferon gold and get CXR today; explained I would expect his oxygen level to be higher; close f/u in two days; start levaquin; cough syrup if needed; if rash, stop; do NOT mix with alcohol or other controlled substances, see AVS for cautions; if getting worse, go to urgent care or ER; explained he is likely infectious and contagious, advised he stay out of work, could get other people sick which could be serious; avoid exposures to nursing homes, daycares, hospitals as visitors; patient agrees with plan      Relevant Orders   Quantiferon tb gold assay (blood)   CBC With Differential/Platelet   DG Chest 2 View (Completed)       Follow up plan: Return in about 2 days (around 09/26/2015) for cough recheck.  Meds ordered this encounter  Medications  . fluticasone (FLONASE) 50 MCG/ACT nasal spray    Sig: Place into  both nostrils daily.  . chlorpheniramine-HYDROcodone (TUSSIONEX PENNKINETIC ER) 10-8 MG/5ML SUER    Sig: Take 5 mLs by mouth every 12 (twelve) hours as needed for cough.    Dispense:  115 mL    Refill:  0  . levofloxacin (LEVAQUIN) 500 MG tablet    Sig: Take 1 tablet (500 mg total) by mouth daily.    Dispense:  7 tablet    Refill:  0  . hydrochlorothiazide (HYDRODIURIL) 25 MG tablet    Sig: Take 1 tablet (25 mg total) by mouth daily.    Dispense:  30 tablet  Refill:  5  . amLODipine (NORVASC) 5 MG tablet    Sig: Take 1 tablet (5 mg total) by mouth daily.    Dispense:  30 tablet    Refill:  5   Orders Placed This Encounter  Procedures  . DG Chest 2 View  . Quantiferon tb gold assay (blood)  . CBC With Differential/Platelet  . Lipid Panel w/o Chol/HDL Ratio  . Comprehensive metabolic panel   An after-visit summary was printed and given to the patient at [facility].  Please see the patient instructions which may contain other information and recommendations beyond what is mentioned above in the assessment and plan.

## 2015-09-24 NOTE — Assessment & Plan Note (Signed)
Check lipids 

## 2015-09-25 LAB — COMPREHENSIVE METABOLIC PANEL
A/G RATIO: 1.8 (ref 1.1–2.5)
ALK PHOS: 95 IU/L (ref 39–117)
ALT: 17 IU/L (ref 0–44)
AST: 19 IU/L (ref 0–40)
Albumin: 4.2 g/dL (ref 3.5–5.5)
BUN/Creatinine Ratio: 13 (ref 9–20)
BUN: 12 mg/dL (ref 6–24)
Bilirubin Total: 0.3 mg/dL (ref 0.0–1.2)
CALCIUM: 9.4 mg/dL (ref 8.7–10.2)
CO2: 23 mmol/L (ref 18–29)
CREATININE: 0.94 mg/dL (ref 0.76–1.27)
Chloride: 98 mmol/L (ref 96–106)
GFR calc Af Amer: 114 mL/min/{1.73_m2} (ref >59)
GFR, EST NON AFRICAN AMERICAN: 98 mL/min/{1.73_m2} (ref >59)
Globulin, Total: 2.3 g/dL (ref 1.5–4.5)
Glucose: 106 mg/dL — ABNORMAL HIGH (ref 65–99)
POTASSIUM: 3.7 mmol/L (ref 3.5–5.2)
SODIUM: 139 mmol/L (ref 134–144)
Total Protein: 6.5 g/dL (ref 6.0–8.5)

## 2015-09-25 LAB — LIPID PANEL W/O CHOL/HDL RATIO
Cholesterol, Total: 222 mg/dL — ABNORMAL HIGH (ref 100–199)
HDL: 69 mg/dL (ref >39)
LDL CALC: 140 mg/dL — AB (ref 0–99)
Triglycerides: 65 mg/dL (ref 0–149)
VLDL CHOLESTEROL CAL: 13 mg/dL (ref 5–40)

## 2015-09-26 ENCOUNTER — Ambulatory Visit: Payer: 59 | Admitting: Family Medicine

## 2015-09-27 ENCOUNTER — Encounter: Payer: Self-pay | Admitting: Family Medicine

## 2015-09-27 LAB — QUANTIFERON TB GOLD ASSAY (BLOOD)

## 2015-09-27 LAB — QUANTIFERON IN TUBE
QFT TB AG MINUS NIL VALUE: 0.02 [IU]/mL
QUANTIFERON NIL VALUE: 0.17 [IU]/mL
QUANTIFERON TB AG VALUE: 0.19 [IU]/mL
QUANTIFERON TB GOLD: NEGATIVE

## 2015-10-07 ENCOUNTER — Other Ambulatory Visit: Payer: Self-pay | Admitting: Family Medicine

## 2015-10-07 NOTE — Telephone Encounter (Signed)
rxs approved 

## 2016-03-08 ENCOUNTER — Other Ambulatory Visit: Payer: Self-pay | Admitting: Family Medicine

## 2016-03-09 NOTE — Telephone Encounter (Signed)
I don't see any upcoming appointments; please verify with patient if he is going to see me here at [facility] (he's due for 6 month f/u) or if he'll be staying at [facility]; route refill to appropriate provider please; thank you

## 2016-03-09 NOTE — Telephone Encounter (Signed)
Left voice message for patient to return call. °

## 2016-03-12 ENCOUNTER — Telehealth: Payer: Self-pay | Admitting: Family Medicine

## 2016-03-12 ENCOUNTER — Other Ambulatory Visit: Payer: Self-pay

## 2016-03-12 MED ORDER — AMLODIPINE BESYLATE 5 MG PO TABS: 5.0000 mg | ORAL_TABLET | Freq: Every day | ORAL | 1 refills | Status: DC

## 2016-03-12 MED ORDER — HYDROCHLOROTHIAZIDE 25 MG PO TABS: 25.0000 mg | ORAL_TABLET | Freq: Every day | ORAL | 1 refills | Status: DC

## 2016-03-12 NOTE — Telephone Encounter (Signed)
lvm for pt to return call again, no response.

## 2016-03-12 NOTE — Telephone Encounter (Signed)
Patient is scheduled to see Dr. Sanda Klein on Fri. 04/16/16 @ 8am.  Please refill medication for enough until his appt.

## 2016-03-12 NOTE — Telephone Encounter (Signed)
Please see other phone note; contact patient to see if he is staying at [facility]; if so forward refill request to appropriate provider If he is going to see me, he'll need appt and then we'll refill after appt scheduled We just want to make sure he has appropriate follow-up and medicines are still correct dose Thank you

## 2016-03-12 NOTE — Telephone Encounter (Signed)
Thank you; we look forward to seeing him; Rxs sent

## 2016-03-16 NOTE — Telephone Encounter (Signed)
Patient has appt scheduled for 12-19-15

## 2016-03-16 NOTE — Telephone Encounter (Signed)
appt made

## 2016-04-16 ENCOUNTER — Encounter: Payer: Self-pay | Admitting: Family Medicine

## 2016-04-16 ENCOUNTER — Ambulatory Visit (INDEPENDENT_AMBULATORY_CARE_PROVIDER_SITE_OTHER): Payer: 59 | Admitting: Family Medicine

## 2016-04-16 VITALS — BP 138/80 | HR 90 | Temp 98.5°F | Wt 182.0 lb

## 2016-04-16 DIAGNOSIS — Z23 Encounter for immunization: Secondary | ICD-10-CM | POA: Diagnosis not present

## 2016-04-16 DIAGNOSIS — R739 Hyperglycemia, unspecified: Secondary | ICD-10-CM

## 2016-04-16 DIAGNOSIS — E785 Hyperlipidemia, unspecified: Secondary | ICD-10-CM | POA: Diagnosis not present

## 2016-04-16 DIAGNOSIS — I10 Essential (primary) hypertension: Secondary | ICD-10-CM | POA: Diagnosis not present

## 2016-04-16 DIAGNOSIS — Z5181 Encounter for therapeutic drug level monitoring: Secondary | ICD-10-CM

## 2016-04-16 DIAGNOSIS — J301 Allergic rhinitis due to pollen: Secondary | ICD-10-CM | POA: Diagnosis not present

## 2016-04-16 HISTORY — DX: Hyperglycemia, unspecified: R73.9

## 2016-04-16 NOTE — Patient Instructions (Signed)
Please do bring by those labs for review in the next few weeks If you can't find them, please have fasting labs done Your goal blood pressure is less than 140 mmHg on top. Try to follow the DASH guidelines (DASH stands for Dietary Approaches to Stop Hypertension) Try to limit the sodium in your diet.  Ideally, consume less than 1.5 grams (less than 1,500mg ) per day. Do not add salt when cooking or at the table.  Check the sodium amount on labels when shopping, and choose items lower in sodium when given a choice. Avoid or limit foods that already contain a lot of sodium. Eat a diet rich in fruits and vegetables and whole grains. You received the flu shot today; it should protect you against the flu virus over the coming months; it will take about two weeks for antibodies to develop; do try to stay away from hospitals, nursing homes, and daycares during peak flu season; taking extra vitamin C daily during flu season may help you avoid getting sick

## 2016-04-16 NOTE — Assessment & Plan Note (Addendum)
Nasal spray, singulair, plain claritin OR allergra, alternates; knows to avoid the decongestants

## 2016-04-16 NOTE — Assessment & Plan Note (Signed)
Check lipids today 

## 2016-04-16 NOTE — Assessment & Plan Note (Signed)
Check A1c. 

## 2016-04-16 NOTE — Assessment & Plan Note (Signed)
Continuing DASH guidelines; continue meds

## 2016-04-16 NOTE — Progress Notes (Signed)
BP 138/80   Pulse 90   Temp 98.5 F (36.9 C) (Oral)   Wt 182 lb (82.6 kg)   SpO2 98%   BMI 28.08 kg/m    Subjective:    Patient ID: Andrew Bond, male    DOB: [birth year], 45 y.o.   MRN: [id number]  HPI: Andrew Bond is a 45 y.o. male  Chief Complaint  Patient presents with  . Medication Refill   He is here for hypertension; BP is higher at the house No decongestants; not eating much salt; active at work; active at home too with chores HTN runs in the family  Allergic rhinitis; kicking up right now; avoiding decongestants; alternates plain claritin or plain allegra, not both; uses singulair and nasal spray too  GERD; using zantac PRN  Left knee flares up from time to time; naproxen PRN  Last glucose just over the cut-off in Feb; drinks diet sodas, does eat white bread  Depression screen PHQ 2/9 04/16/2016  Decreased Interest 0  Down, Depressed, Hopeless 0  PHQ - 2 Score 0   Relevant past medical, surgical, family and social history reviewed Past Medical History:  Diagnosis Date  . Allergic rhinitis due to pollen 04/01/2015  . Dyslipidemia 04/22/2015  . Essential hypertension, benign 04/01/2015  . Hyperglycemia 04/16/2016   Past Surgical History:  Procedure Laterality Date  . HERNIA REPAIR     x6  . NM RENAL LASIX ([place] HX)    . REFRACTIVE SURGERY    . TOE SURGERY    . WRIST SURGERY Bilateral    carpal tunnel   Family History  Problem Relation Age of Onset  . Hypertension Mother   . Heart disease Father   . Heart attack Father   . Hypertension Brother   . Cancer Maternal Grandmother     lymph nodes, spread to lungs   Social History  Substance Use Topics  . Smoking status: Former Smoker    Packs/day: 0.50    Years: 10.00    Types: Cigarettes    Quit date: 08/02/2014  . Smokeless tobacco: Never Used  . Alcohol use No   Interim medical history since last visit reviewed. Allergies and medications reviewed  Review of  Systems Per HPI unless specifically indicated above     Objective:    BP 138/80   Pulse 90   Temp 98.5 F (36.9 C) (Oral)   Wt 182 lb (82.6 kg)   SpO2 98%   BMI 28.08 kg/m   Wt Readings from Last 3 Encounters:  04/16/16 182 lb (82.6 kg)  09/24/15 183 lb 12.8 oz (83.4 kg)  04/22/15 189 lb (85.7 kg)    Physical Exam  Constitutional: He appears well-developed and well-nourished. No distress.  HENT:  Head: Normocephalic and atraumatic.  Eyes: EOM are normal. No scleral icterus.  Neck: No thyromegaly present.  Cardiovascular: Normal rate and regular rhythm.   Pulmonary/Chest: Effort normal and breath sounds normal.  Abdominal: Soft. Bowel sounds are normal. He exhibits no distension.  Musculoskeletal: He exhibits no edema.  Neurological: He is alert.  Skin: Skin is warm and dry. No pallor.  Psychiatric: He has a normal mood and affect. His behavior is normal. Judgment and thought content normal.   Results for orders placed or performed in visit on 09/24/15  Quantiferon tb gold assay (blood)  Result Value Ref Range   QUANTIFERON INCUBATION Comment   CBC With Differential/Platelet  Result Value Ref Range   WBC 5.0 3.4 -  10.8 x10E3/uL   RBC 4.85 4.14 - 5.80 x10E6/uL   Hemoglobin 15.2 12.6 - 17.7 g/dL   Hematocrit 42.7 37.5 - 51.0 %   MCV 88 79 - 97 fL   MCH 31.3 26.6 - 33.0 pg   MCHC 35.6 31.5 - 35.7 g/dL   RDW 13.5 12.3 - 15.4 %   Platelets 307 150 - 379 x10E3/uL   Neutrophils 72 %   Lymphs 15 %   MID 13 %   Neutrophils Absolute 3.6 1.4 - 7.0 x10E3/uL   Lymphocytes Absolute 0.8 0.7 - 3.1 x10E3/uL   MID (Absolute) 0.6 0.1 - 1.6 X10E3/uL  Lipid Panel w/o Chol/HDL Ratio  Result Value Ref Range   Cholesterol, Total 222 (H) 100 - 199 mg/dL   Triglycerides 65 0 - 149 mg/dL   HDL 69 >39 mg/dL   VLDL Cholesterol Cal 13 5 - 40 mg/dL   LDL Calculated 140 (H) 0 - 99 mg/dL  Comprehensive metabolic panel  Result Value Ref Range   Glucose 106 (H) 65 - 99 mg/dL   BUN 12 6 -  24 mg/dL   Creatinine, Ser 0.94 0.76 - 1.27 mg/dL   GFR calc non Af Amer 98 >59 mL/min/1.73   GFR calc Af Amer 114 >59 mL/min/1.73   BUN/Creatinine Ratio 13 9 - 20   Sodium 139 134 - 144 mmol/L   Potassium 3.7 3.5 - 5.2 mmol/L   Chloride 98 96 - 106 mmol/L   CO2 23 18 - 29 mmol/L   Calcium 9.4 8.7 - 10.2 mg/dL   Total Protein 6.5 6.0 - 8.5 g/dL   Albumin 4.2 3.5 - 5.5 g/dL   Globulin, Total 2.3 1.5 - 4.5 g/dL   Albumin/Globulin Ratio 1.8 1.1 - 2.5   Bilirubin Total 0.3 0.0 - 1.2 mg/dL   Alkaline Phosphatase 95 39 - 117 IU/L   AST 19 0 - 40 IU/L   ALT 17 0 - 44 IU/L  QuantiFERON In Tube  Result Value Ref Range   QUANTIFERON TB GOLD Negative Negative   QUANTIFERON CRITERIA Comment    QUANTIFERON TB AG VALUE 0.19 IU/mL   Quantiferon Nil Value 0.17 IU/mL   QUANTIFERON MITOGEN VALUE >10.00 IU/mL   QFT TB AG MINUS NIL VALUE 0.02 IU/mL   Interpretation: Comment       Assessment & Plan:   Problem List Items Addressed This Visit      Cardiovascular and Mediastinum   Essential hypertension, benign - Primary (Chronic)    Continuing DASH guidelines; continue meds        Respiratory   Allergic rhinitis due to pollen (Chronic)    Nasal spray, singulair, plain claritin OR allergra, alternates; knows to avoid the decongestants        Other   Medication monitoring encounter    Check labs      Relevant Orders   COMPLETE METABOLIC PANEL WITH GFR   Hyperglycemia    Check A1c      Relevant Orders   Hemoglobin A1c   Dyslipidemia (Chronic)    Check lipids today      Relevant Orders   Lipid panel    Other Visit Diagnoses    Needs flu shot       Relevant Orders   Flu Vaccine QUAD 36+ mos PF IM (Fluarix & Fluzone Quad PF) (Completed)       Follow up plan: Return in about 6 months (around 10/14/2016).  An after-visit summary was printed and given to the patient at  check-out.  Please see the patient instructions which may contain other information and recommendations  beyond what is mentioned above in the assessment and plan.  Meds ordered this encounter  Medications  . ranitidine (ZANTAC) 300 MG tablet    Sig: Take by mouth.  . montelukast (SINGULAIR) 10 MG tablet    Sig: Take 10 mg by mouth as needed.   . fexofenadine (ALLEGRA) 180 MG tablet    Sig: Take 180 mg by mouth as needed.     Orders Placed This Encounter  Procedures  . Flu Vaccine QUAD 36+ mos PF IM (Fluarix & Fluzone Quad PF)  . Lipid panel  . COMPLETE METABOLIC PANEL WITH GFR  . Hemoglobin A1c

## 2016-04-16 NOTE — Assessment & Plan Note (Signed)
Check labs 

## 2016-09-29 ENCOUNTER — Telehealth: Payer: Self-pay

## 2016-09-29 NOTE — Telephone Encounter (Signed)
Labs over due. Left a detail voicemail asking pt to come in.

## 2016-10-14 ENCOUNTER — Encounter: Payer: Self-pay | Admitting: Family Medicine

## 2016-10-14 ENCOUNTER — Ambulatory Visit (INDEPENDENT_AMBULATORY_CARE_PROVIDER_SITE_OTHER): Payer: 59 | Admitting: Family Medicine

## 2016-10-14 ENCOUNTER — Telehealth: Payer: Self-pay

## 2016-10-14 DIAGNOSIS — M542 Cervicalgia: Secondary | ICD-10-CM

## 2016-10-14 DIAGNOSIS — I10 Essential (primary) hypertension: Secondary | ICD-10-CM | POA: Diagnosis not present

## 2016-10-14 DIAGNOSIS — R739 Hyperglycemia, unspecified: Secondary | ICD-10-CM | POA: Diagnosis not present

## 2016-10-14 DIAGNOSIS — Z5181 Encounter for therapeutic drug level monitoring: Secondary | ICD-10-CM

## 2016-10-14 DIAGNOSIS — G8929 Other chronic pain: Secondary | ICD-10-CM | POA: Insufficient documentation

## 2016-10-14 DIAGNOSIS — E785 Hyperlipidemia, unspecified: Secondary | ICD-10-CM | POA: Diagnosis not present

## 2016-10-14 NOTE — Assessment & Plan Note (Signed)
Check Cr today; encourage less salt, weight loss, healthier eating

## 2016-10-14 NOTE — Progress Notes (Signed)
BP 136/78   Pulse 88   Temp 97.9 F (36.6 C) (Oral)   Resp 14   Wt 180 lb 3.2 oz (81.7 kg)   SpO2 97%   BMI 27.81 kg/m    Subjective:    Patient ID: Andrew Bond, male    DOB: Aug 13, 1970, 46 y.o.   MRN: [id number]  HPI: Andrew Bond is a 46 y.o. male  Chief Complaint  Patient presents with  . Follow-up   Patient is here for follow-up  Pain is on the right side of the neck; weeks or months; actually going on for years when I pressed him; getting worse; no weakness or numbness in the arms; pain just in the neck area; real sharp; no grinding or popping or crunching; he has not tried muscle relaxers; pulsating pain; he thinks it might be blood vessel issue Uncle with carotid athero Typical childhood accidents, nothing serious; no previous xrays down No known family hx of arthritis  High cholesterol; not watching a good diet, he admits; he'll eat almost anything; time issues are his big thing he says; wife does most of the cooking, he gets off at 6 pm; sandwich for lunch, ham and [place]; not interested in salads  High glucose; drinking diet drinks; drinking enough water; eats white bread; not interested in wheat bread  High blood pressure; checking BP away from the doctor; highest 150, lowest 110; adding less salt  Depression screen [facility] 2/9 10/14/2016 04/16/2016  Decreased Interest 0 0  Down, Depressed, Hopeless 0 0  PHQ - 2 Score 0 0   Relevant past medical, surgical, family and social history reviewed Past Medical History:  Diagnosis Date  . Allergic rhinitis due to pollen 04/01/2015  . Dyslipidemia 04/22/2015  . Essential hypertension, benign 04/01/2015  . Hyperglycemia 04/16/2016   Past Surgical History:  Procedure Laterality Date  . HERNIA REPAIR     x6  . NM RENAL LASIX ([place] HX)    . REFRACTIVE SURGERY    . TOE SURGERY    . WRIST SURGERY Bilateral    carpal tunnel   Family History  Problem Relation Age of Onset  . Hypertension  Mother   . Cancer Mother     cervical  . Heart disease Father   . Heart attack Father   . Hypertension Brother   . Hyperlipidemia Brother   . Cancer Maternal Grandmother     lymph nodes, spread to lungs   Social History  Substance Use Topics  . Smoking status: Former Smoker    Packs/day: 0.50    Years: 10.00    Types: Cigarettes    Quit date: 08/02/2014  . Smokeless tobacco: Never Used  . Alcohol use No   Interim medical history since last visit reviewed. Allergies and medications reviewed  Review of Systems Per HPI unless specifically indicated above     Objective:    BP 136/78   Pulse 88   Temp 97.9 F (36.6 C) (Oral)   Resp 14   Wt 180 lb 3.2 oz (81.7 kg)   SpO2 97%   BMI 27.81 kg/m   Wt Readings from Last 3 Encounters:  10/14/16 180 lb 3.2 oz (81.7 kg)  04/16/16 182 lb (82.6 kg)  09/24/15 183 lb 12.8 oz (83.4 kg)    Physical Exam  Constitutional: He appears well-developed and well-nourished. No distress.  HENT:  Head: Normocephalic and atraumatic.  Eyes: EOM are normal. No scleral icterus.  Neck: Normal range of motion  and full passive range of motion without pain. Neck supple. No JVD present. No spinous process tenderness and no muscular tenderness present. Carotid bruit is not present. No erythema present. No thyromegaly present.  Cardiovascular: Normal rate and regular rhythm.   Pulmonary/Chest: Effort normal and breath sounds normal.  Abdominal: Soft. Bowel sounds are normal. He exhibits no distension.  Musculoskeletal: He exhibits no edema.  Neurological: He is alert. He displays no tremor.  UE strength 5/5  Skin: Skin is warm and dry. No pallor.  Psychiatric: He has a normal mood and affect. His behavior is normal. Judgment and thought content normal.    Results for orders placed or performed in visit on 09/24/15  Quantiferon tb gold assay (blood)  Result Value Ref Range   QUANTIFERON INCUBATION Comment   CBC With Differential/Platelet  Result  Value Ref Range   WBC 5.0 3.4 - 10.8 x10E3/uL   RBC 4.85 4.14 - 5.80 x10E6/uL   Hemoglobin 15.2 12.6 - 17.7 g/dL   Hematocrit 42.7 37.5 - 51.0 %   MCV 88 79 - 97 fL   MCH 31.3 26.6 - 33.0 pg   MCHC 35.6 31.5 - 35.7 g/dL   RDW 13.5 12.3 - 15.4 %   Platelets 307 150 - 379 x10E3/uL   Neutrophils 72 %   Lymphs 15 %   MID 13 %   Neutrophils Absolute 3.6 1.4 - 7.0 x10E3/uL   Lymphocytes Absolute 0.8 0.7 - 3.1 x10E3/uL   MID (Absolute) 0.6 0.1 - 1.6 X10E3/uL  Lipid Panel w/o Chol/HDL Ratio  Result Value Ref Range   Cholesterol, Total 222 (H) 100 - 199 mg/dL   Triglycerides 65 0 - 149 mg/dL   HDL 69 >39 mg/dL   VLDL Cholesterol Cal 13 5 - 40 mg/dL   LDL Calculated 140 (H) 0 - 99 mg/dL  Comprehensive metabolic panel  Result Value Ref Range   Glucose 106 (H) 65 - 99 mg/dL   BUN 12 6 - 24 mg/dL   Creatinine, Ser 0.94 0.76 - 1.27 mg/dL   GFR calc non Af Amer 98 >59 mL/min/1.73   GFR calc Af Amer 114 >59 mL/min/1.73   BUN/Creatinine Ratio 13 9 - 20   Sodium 139 134 - 144 mmol/L   Potassium 3.7 3.5 - 5.2 mmol/L   Chloride 98 96 - 106 mmol/L   CO2 23 18 - 29 mmol/L   Calcium 9.4 8.7 - 10.2 mg/dL   Total Protein 6.5 6.0 - 8.5 g/dL   Albumin 4.2 3.5 - 5.5 g/dL   Globulin, Total 2.3 1.5 - 4.5 g/dL   Albumin/Globulin Ratio 1.8 1.1 - 2.5   Bilirubin Total 0.3 0.0 - 1.2 mg/dL   Alkaline Phosphatase 95 39 - 117 IU/L   AST 19 0 - 40 IU/L   ALT 17 0 - 44 IU/L  QuantiFERON In Tube  Result Value Ref Range   QUANTIFERON TB GOLD Negative Negative   QUANTIFERON CRITERIA Comment    QUANTIFERON TB AG VALUE 0.19 IU/mL   Quantiferon Nil Value 0.17 IU/mL   QUANTIFERON MITOGEN VALUE >10.00 IU/mL   QFT TB AG MINUS NIL VALUE 0.02 IU/mL   Interpretation: Comment       Assessment & Plan:   Problem List Items Addressed This Visit      Cardiovascular and Mediastinum   Essential hypertension, benign (Chronic)    Check Cr today; encourage less salt, weight loss, healthier eating      Relevant  Medications   aspirin EC 81  MG tablet     Other   Neck pain, chronic    Wanted to start with xrays and refer for therapy; patient declined therapy recommendation; heat before and ice after suggested; he really thinks there is something wrong with his circulation; he declined PT, wants to do CT scan to look at arteries; will get CT scan      Relevant Medications   aspirin EC 81 MG tablet   Other Relevant Orders   CT ANGIO NECK W OR WO CONTRAST   Medication monitoring encounter    Check labs      Relevant Orders   Comprehensive Metabolic Panel (CMET)   Hyperglycemia    Recheck A1c; glucose; encouraged weight loss, healthy eating      Relevant Orders   Hemoglobin A1c   Dyslipidemia (Chronic)    Check fasting lipids, limit saturated fats      Relevant Orders   Lipid panel       Follow up plan: No Follow-up on file.  An after-visit summary was printed and given to the patient at [facility].  Please see the patient instructions which may contain other information and recommendations beyond what is mentioned above in the assessment and plan.  Meds ordered this encounter  Medications  . aspirin EC 81 MG tablet    Sig: Take 1 tablet (81 mg total) by mouth daily.    Orders Placed This Encounter  Procedures  . CT ANGIO NECK W OR WO CONTRAST  . Lipid panel  . Hemoglobin A1c  . Comprehensive Metabolic Panel (CMET)

## 2016-10-14 NOTE — Assessment & Plan Note (Signed)
Recheck A1c; glucose; encouraged weight loss, healthy eating

## 2016-10-14 NOTE — Assessment & Plan Note (Signed)
Check labs 

## 2016-10-14 NOTE — Assessment & Plan Note (Addendum)
Wanted to start with xrays and refer for therapy; patient declined therapy recommendation; heat before and ice after suggested; he really thinks there is something wrong with his circulation; he declined PT, wants to do CT scan to look at arteries; will get CT scan

## 2016-10-14 NOTE — Patient Instructions (Addendum)
We'll get a CT scan of the neck Please have fasting labs done If you have not heard anything from my staff in a week about any orders/referrals/studies from today, please contact us here to follow-up [phone] Try to limit saturated fats in your diet (bologna, hot dogs, barbeque, cheeseburgers, hamburgers, steak, bacon, sausage, cheese, etc.) and get more fresh fruits, vegetables, and whole grains Your goal blood pressure is less than 140 mmHg on top. Try to follow the DASH guidelines (DASH stands for Dietary Approaches to Stop Hypertension) Try to limit the sodium in your diet.  Ideally, consume less than 1.5 grams (less than 1,500mg ) per day. Do not add salt when cooking or at the table.  Check the sodium amount on labels when shopping, and choose items lower in sodium when given a choice. Avoid or limit foods that already contain a lot of sodium. Eat a diet rich in fruits and vegetables and whole grains.  DASH Eating Plan DASH stands for "Dietary Approaches to Stop Hypertension." The DASH eating plan is a healthy eating plan that has been shown to reduce high blood pressure (hypertension). It may also reduce your risk for type 2 diabetes, heart disease, and stroke. The DASH eating plan may also help with weight loss. What are tips for following this plan? General guidelines   Avoid eating more than 2,300 mg (milligrams) of salt (sodium) a day. If you have hypertension, you may need to reduce your sodium intake to 1,500 mg a day.  Limit alcohol intake to no more than 1 drink a day for nonpregnant women and 2 drinks a day for men. One drink equals 12 oz of beer, 5 oz of wine, or 1 oz of hard liquor.  Work with your health care provider to maintain a healthy body weight or to lose weight. Ask what an ideal weight is for you.  Get at least 30 minutes of exercise that causes your heart to beat faster (aerobic exercise) most days of the week. Activities may include walking, swimming, or  biking.  Work with your health care provider or diet and nutrition specialist (dietitian) to adjust your eating plan to your individual calorie needs. Reading food labels   Check food labels for the amount of sodium per serving. Choose foods with less than 5 percent of the Daily Value of sodium. Generally, foods with less than 300 mg of sodium per serving fit into this eating plan.  To find whole grains, look for the word "whole" as the first word in the ingredient list. Shopping   Buy products labeled as "low-sodium" or "no salt added."  Buy fresh foods. Avoid canned foods and premade or frozen meals. Cooking   Avoid adding salt when cooking. Use salt-free seasonings or herbs instead of table salt or sea salt. Check with your health care provider or pharmacist before using salt substitutes.  Do not fry foods. Cook foods using healthy methods such as baking, boiling, grilling, and broiling instead.  Cook with heart-healthy oils, such as olive, canola, soybean, or sunflower oil. Meal planning    Eat a balanced diet that includes:  5 or more servings of fruits and vegetables each day. At each meal, try to fill half of your plate with fruits and vegetables.  Up to 6-8 servings of whole grains each day.  Less than 6 oz of lean meat, poultry, or fish each day. A 3-oz serving of meat is about the same size as a deck of cards. One egg equals  1 oz.  2 servings of low-fat dairy each day.  A serving of nuts, seeds, or beans 5 times each week.  Heart-healthy fats. Healthy fats called Omega-3 fatty acids are found in foods such as flaxseeds and coldwater fish, like sardines, salmon, and mackerel.  Limit how much you eat of the following:  Canned or prepackaged foods.  Food that is high in trans fat, such as fried foods.  Food that is high in saturated fat, such as fatty meat.  Sweets, desserts, sugary drinks, and other foods with added sugar.  Full-fat dairy products.  Do not  salt foods before eating.  Try to eat at least 2 vegetarian meals each week.  Eat more home-cooked food and less restaurant, buffet, and fast food.  When eating at a restaurant, ask that your food be prepared with less salt or no salt, if possible. What foods are recommended? The items listed may not be a complete list. Talk with your dietitian about what dietary choices are best for you. Grains  Whole-grain or whole-wheat bread. Whole-grain or whole-wheat pasta. Brown rice. [place]. Bulgur. Whole-grain and low-sodium cereals. Pita bread. Low-fat, low-sodium crackers. Whole-wheat flour tortillas. Vegetables  Fresh or frozen vegetables (raw, steamed, roasted, or grilled). Low-sodium or reduced-sodium tomato and vegetable juice. Low-sodium or reduced-sodium tomato sauce and tomato paste. Low-sodium or reduced-sodium canned vegetables. Fruits  All fresh, dried, or frozen fruit. Canned fruit in natural juice (without added sugar). Meat and other protein foods  Skinless chicken or [place]. Ground chicken or [place]. Pork with fat trimmed off. Fish and seafood. Egg whites. Dried beans, peas, or lentils. Unsalted nuts, nut butters, and seeds. Unsalted canned beans. Lean cuts of beef with fat trimmed off. Low-sodium, lean deli meat. Dairy  Low-fat (1%) or fat-free (skim) milk. Fat-free, low-fat, or reduced-fat cheeses. Nonfat, low-sodium ricotta or cottage cheese. Low-fat or nonfat yogurt. Low-fat, low-sodium cheese. Fats and oils  Soft margarine without trans fats. Vegetable oil. Low-fat, reduced-fat, or light mayonnaise and salad dressings (reduced-sodium). Canola, safflower, olive, soybean, and sunflower oils. Avocado. Seasoning and other foods  Herbs. Spices. Seasoning mixes without salt. Unsalted popcorn and pretzels. Fat-free sweets. What foods are not recommended? The items listed may not be a complete list. Talk with your dietitian about what dietary choices are best for you. Grains   Baked goods made with fat, such as croissants, muffins, or some breads. Dry pasta or rice meal packs. Vegetables  Creamed or fried vegetables. Vegetables in a cheese sauce. Regular canned vegetables (not low-sodium or reduced-sodium). Regular canned tomato sauce and paste (not low-sodium or reduced-sodium). Regular tomato and vegetable juice (not low-sodium or reduced-sodium). Angie Fava. Olives. Fruits  Canned fruit in a light or heavy syrup. Fried fruit. Fruit in cream or butter sauce. Meat and other protein foods  Fatty cuts of meat. Ribs. Fried meat. Berniece Salines. Sausage. Bologna and other processed lunch meats. Salami. Fatback. Hotdogs. Bratwurst. Salted nuts and seeds. Canned beans with added salt. Canned or smoked fish. Whole eggs or egg yolks. Chicken or [place] with skin. Dairy  Whole or 2% milk, cream, and half-and-half. Whole or full-fat cream cheese. Whole-fat or sweetened yogurt. Full-fat cheese. Nondairy creamers. Whipped toppings. Processed cheese and cheese spreads. Fats and oils  Butter. Stick margarine. Lard. Shortening. Ghee. Bacon fat. Tropical oils, such as coconut, palm kernel, or palm oil. Seasoning and other foods  Salted popcorn and pretzels. Onion salt, garlic salt, seasoned salt, table salt, and sea salt. Worcestershire sauce. Tartar sauce. Barbecue sauce. Teriyaki  sauce. Soy sauce, including reduced-sodium. Steak sauce. Canned and packaged gravies. Fish sauce. Oyster sauce. Cocktail sauce. Horseradish that you find on the shelf. Ketchup. Mustard. Meat flavorings and tenderizers. Bouillon cubes. Hot sauce and Tabasco sauce. Premade or packaged marinades. Premade or packaged taco seasonings. Relishes. Regular salad dressings. Where to find more information:  National Heart, Lung, and [place]: https://wilson-eaton.com/  American Heart Association: www.heart.org Summary  The DASH eating plan is a healthy eating plan that has been shown to reduce high blood pressure (hypertension).  It may also reduce your risk for type 2 diabetes, heart disease, and stroke.  With the DASH eating plan, you should limit salt (sodium) intake to 2,300 mg a day. If you have hypertension, you may need to reduce your sodium intake to 1,500 mg a day.  When on the DASH eating plan, aim to eat more fresh fruits and vegetables, whole grains, lean proteins, low-fat dairy, and heart-healthy fats.  Work with your health care provider or diet and nutrition specialist (dietitian) to adjust your eating plan to your individual calorie needs. This information is not intended to replace advice given to you by your health care provider. Make sure you discuss any questions you have with your health care provider. Document Released: 07/08/2011 Document Revised: 07/12/2016 Document Reviewed: 07/12/2016 Elsevier Interactive Patient Education  2017 [employer].

## 2016-10-14 NOTE — Assessment & Plan Note (Addendum)
Check fasting lipids, limit saturated fats

## 2016-10-14 NOTE — Telephone Encounter (Signed)
Patient was informed that he has been scheduled for his CT scan on 10/21/16 @ 2:30pm at the [facility]. Patient expressed verbal understanding and said thanks.

## 2016-10-15 ENCOUNTER — Other Ambulatory Visit: Payer: Self-pay | Admitting: Family Medicine

## 2016-10-15 ENCOUNTER — Telehealth: Payer: Self-pay

## 2016-10-15 LAB — COMPREHENSIVE METABOLIC PANEL
ALT: 18 IU/L (ref 0–44)
AST: 19 IU/L (ref 0–40)
Albumin/Globulin Ratio: 1.7 (ref 1.2–2.2)
Albumin: 4.4 g/dL (ref 3.5–5.5)
Alkaline Phosphatase: 100 IU/L (ref 39–117)
BUN/Creatinine Ratio: 14 (ref 9–20)
BUN: 15 mg/dL (ref 6–24)
Bilirubin Total: 0.4 mg/dL (ref 0.0–1.2)
CALCIUM: 9.8 mg/dL (ref 8.7–10.2)
CO2: 23 mmol/L (ref 18–29)
Chloride: 101 mmol/L (ref 96–106)
Creatinine, Ser: 1.08 mg/dL (ref 0.76–1.27)
GFR calc Af Amer: 95 mL/min/{1.73_m2} (ref >59)
GFR, EST NON AFRICAN AMERICAN: 82 mL/min/{1.73_m2} (ref >59)
GLOBULIN, TOTAL: 2.6 g/dL (ref 1.5–4.5)
Glucose: 85 mg/dL (ref 65–99)
Potassium: 4.4 mmol/L (ref 3.5–5.2)
SODIUM: 141 mmol/L (ref 134–144)
Total Protein: 7 g/dL (ref 6.0–8.5)

## 2016-10-15 LAB — LIPID PANEL
CHOL/HDL RATIO: 3.8 ratio (ref 0.0–5.0)
Cholesterol, Total: 287 mg/dL — ABNORMAL HIGH (ref 100–199)
HDL: 75 mg/dL (ref >39)
LDL Calculated: 190 mg/dL — ABNORMAL HIGH (ref 0–99)
Triglycerides: 109 mg/dL (ref 0–149)
VLDL CHOLESTEROL CAL: 22 mg/dL (ref 5–40)

## 2016-10-15 LAB — HEMOGLOBIN A1C
Est. average glucose Bld gHb Est-mCnc: 103 mg/dL
HEMOGLOBIN A1C: 5.2 % (ref 4.8–5.6)

## 2016-10-15 MED ORDER — ATORVASTATIN CALCIUM 40 MG PO TABS: 40.0000 mg | ORAL_TABLET | Freq: Every day | ORAL | 1 refills | Status: DC

## 2016-10-15 NOTE — Progress Notes (Signed)
New Rx for statin

## 2016-10-19 ENCOUNTER — Telehealth: Payer: Self-pay | Admitting: Family Medicine

## 2016-10-19 NOTE — Telephone Encounter (Signed)
Please let the patient know that the insurance company is not going to approve the scan I'll be glad to refer him to a specialist (orthopaedist if he thinks arthritis, pinched nerve, or vascular if he thinks it's blood vessel related)

## 2016-10-19 NOTE — Telephone Encounter (Signed)
error 

## 2016-10-20 NOTE — Telephone Encounter (Signed)
Per the request of Dr. Enid Derry, I contacted this patient to inform him that his procedure was not approved even with doing a peer to peer and to see if he wanted to be referred to an orthopedist so they can f/u on this issue since, but he stated he was ok at the moment and does not want to proceed with anything.   I told him that was fine but if he changed his mind to just give [place] a call and the referral will be placed. He said ok.

## 2016-10-21 ENCOUNTER — Telehealth: Payer: Self-pay | Admitting: Family Medicine

## 2016-10-21 ENCOUNTER — Ambulatory Visit: Admission: RE | Admit: 2016-10-21 | Payer: 59 | Source: Ambulatory Visit

## 2016-10-21 DIAGNOSIS — M542 Cervicalgia: Principal | ICD-10-CM

## 2016-10-21 DIAGNOSIS — G8929 Other chronic pain: Secondary | ICD-10-CM

## 2016-10-21 NOTE — Telephone Encounter (Signed)
-----   Message from Dennard Schaumann, [place] sent at 10/21/2016  9:13 AM EDT ----- Good morning,   This patient's wife called wanting to know why his procedure was cancelled. When I informed her that I had already spoken with her husband about this she stated that she called the insurance company and it was not denied but on hold needing additional clinical notes. She went on to say that she wants him to have this procedure, so I contacted [employer] to see if there was anything I could do: fax over clinical notes, etc and they said it must be a peer to peer and that you have 3 days before this case closes out.  Case #[id number] [phone]   Please advise

## 2016-10-21 NOTE — Telephone Encounter (Signed)
It is nearly 5 pm on Thursday. I will call on Friday.

## 2016-10-22 NOTE — Assessment & Plan Note (Addendum)
Order plain films and CT scan

## 2016-10-22 NOTE — Telephone Encounter (Signed)
Andrew Bond, let's try to run the CT through again; he's getting plain films and please describe the pain as chronic and pulsating; r/o vascular anomaly

## 2016-10-22 NOTE — Telephone Encounter (Signed)
I called patient He gets sharp pain on the right side of the neck Every once in a while, gets dizzy and light-headed That doesn't change if he turns his head Doesn't have any radicular pains Pain going on for years Getting progressively worse "hurts like hell" At times it is pulsating No swollen lymph nodes No unexplained night sweats or weight loss His dad passed away from a stroke, it made him wonder about himself; worried about a blockage If failed, then carotid US since he's worried about blockage Plain films ordered, he can go today or Monday

## 2016-10-24 ENCOUNTER — Other Ambulatory Visit: Payer: Self-pay | Admitting: Family Medicine

## 2016-10-25 ENCOUNTER — Other Ambulatory Visit: Payer: Self-pay

## 2016-10-25 DIAGNOSIS — Z5181 Encounter for therapeutic drug level monitoring: Secondary | ICD-10-CM

## 2016-10-25 DIAGNOSIS — E785 Hyperlipidemia, unspecified: Secondary | ICD-10-CM

## 2016-10-25 NOTE — Telephone Encounter (Signed)
Patient was informed that an order for xray was placed to help get his CT approved and that he could go at anytime to have them done. Patient was given the hours and locations so he could choose whichever was best for him.   He said ok and thanks.  Once that has been completed I will try again with getting the CT approved.

## 2016-12-07 ENCOUNTER — Ambulatory Visit (INDEPENDENT_AMBULATORY_CARE_PROVIDER_SITE_OTHER): Payer: No Typology Code available for payment source

## 2016-12-07 ENCOUNTER — Ambulatory Visit
Admission: EM | Admit: 2016-12-07 | Discharge: 2016-12-07 | Disposition: A | Discharge status: Home / Self Care | Payer: No Typology Code available for payment source | Attending: Family Medicine | Admitting: Family Medicine

## 2016-12-07 ENCOUNTER — Encounter: Payer: Self-pay | Admitting: Gynecology

## 2016-12-07 ENCOUNTER — Ambulatory Visit
Admission: EM | Admit: 2016-12-07 | Discharge: 2016-12-07 | Discharge status: Absent without leave | Payer: No Typology Code available for payment source

## 2016-12-07 DIAGNOSIS — M25512 Pain in left shoulder: Secondary | ICD-10-CM

## 2016-12-07 DIAGNOSIS — M5412 Radiculopathy, cervical region: Secondary | ICD-10-CM

## 2016-12-07 MED ORDER — METAXALONE 800 MG PO TABS: 800.0000 mg | ORAL_TABLET | Freq: Three times a day (TID) | ORAL | 0 refills | Status: DC

## 2016-12-07 MED ORDER — NAPROXEN 500 MG PO TABS: 500.0000 mg | ORAL_TABLET | Freq: Two times a day (BID) | ORAL | 0 refills | Status: DC

## 2016-12-07 MED ORDER — KETOROLAC TROMETHAMINE 60 MG/2ML IM SOLN
60.0000 mg | Freq: Once | INTRAMUSCULAR | Status: AC
  Administered 2016-12-07: 60 mg via INTRAMUSCULAR

## 2016-12-07 NOTE — ED Provider Notes (Signed)
CSN: [id number]     Arrival date & time 12/07/16  1501 History   None    Chief Complaint  Patient presents with  . Shoulder Pain   (Consider location/radiation/quality/duration/timing/severity/associated sxs/prior Treatment) HPI  This a 46 year old male who presents with left neck and shoulder pain for the last 3 days. Is not remember any specific injury. He has not noticed any change in his physical activity. He has not required to lift heavy objects at work. Pain radiates down his arm and into his thumb and index finger of his left hand. Is mostly numbness that he feels in his fingers but pain is from his neck into his trapezial muscles and into the shoulder. Neck movement is extremely uncomfortable.        Past Medical History:  Diagnosis Date  . GERD (gastroesophageal reflux disease)   . Hyperlipemia   . Hypertension    Past Surgical History:  Procedure Laterality Date  . CATARACT EXTRACTION    . LASIK     No family history on file. Social History  Substance Use Topics  . Smoking status: Former Games developermoker  . Smokeless tobacco: Former NeurosurgeonUser  . Alcohol use 0.0 oz/week    Review of Systems  Constitutional: Positive for activity change. Negative for chills, fatigue and fever.  Musculoskeletal: Positive for neck pain and neck stiffness.  All other systems reviewed and are negative.   Allergies  Iodinated diagnostic agents  Home Medications   Prior to Admission medications   Medication Sig Start Date End Date Taking? Authorizing Provider  bisoprolol-hydrochlorothiazide (ZIAC) 2.5-6.25 MG per tablet Take 1 tablet by mouth daily.   Yes [provider]  cimetidine (TAGAMET) 200 MG tablet Take 200 mg by mouth 2 (two) times daily.   Yes [provider]  fexofenadine (ALLEGRA) 180 MG tablet Take 180 mg by mouth daily.   Yes [provider]  lisinopril (PRINIVIL,ZESTRIL) 20 MG tablet Take 20 mg by mouth daily.   Yes [provider]  lovastatin  (MEVACOR) 20 MG tablet Take 20 mg by mouth at bedtime.   Yes [provider]  omeprazole (PRILOSEC) 20 MG capsule Take 20 mg by mouth daily.   Yes [provider]  metaxalone (SKELAXIN) 800 MG tablet Take 1 tablet (800 mg total) by mouth 3 (three) times daily. 12/07/16   Lutricia Feiloemer, William P, PA-C  naproxen (NAPROSYN) 500 MG tablet Take 1 tablet (500 mg total) by mouth 2 (two) times daily with a meal. 12/07/16   Lutricia Feiloemer, William P, PA-C   Meds Ordered and Administered this Visit   Medications  ketorolac (TORADOL) injection 60 mg (60 mg Intramuscular Given 12/07/16 1638)    BP 123/78 (BP Location: Left Arm)   Pulse 89   Temp 98.1 F (36.7 C) (Oral)   Resp 16   Ht 5\' 8"  (1.727 m)   Wt 225 lb (102.1 kg)   SpO2 97%   BMI 34.21 kg/m  No data found.   Physical Exam  Constitutional: He is oriented to person, place, and time. He appears well-developed and well-nourished. No distress.  HENT:  Head: Normocephalic and atraumatic.  Eyes: EOM are normal. Pupils are equal, round, and reactive to light. Right eye exhibits no discharge. Left eye exhibits no discharge.  Neck:  Examination of the cervical spine shows marked decreased range of motion with discomfort at the extremes of flexion extension rotation and left lateral flexion and extension. There is tenderness and muscle spasm in the left trapezial muscle. Upper  extremity strength is intact to light touch. Patient has hypesthesia to light touch in a C6 distribution involving the thumb and index finger on the left. DTRs the upper extremity is somewhat 1+ over 4 bilaterally symmetrical  Musculoskeletal: He exhibits tenderness.  Referred to cervical examination  Neurological: He is alert and oriented to person, place, and time.  Skin: Skin is warm and dry. He is not diaphoretic.  Psychiatric: He has a normal mood and affect. His behavior is normal. Judgment and thought content normal.  Nursing note and vitals reviewed.   Urgent  Care Course     Procedures (including critical care time)  Labs Review Labs Reviewed - No data to display  Imaging Review Dg Cervical Spine Complete  Result Date: 12/07/2016 CLINICAL DATA:  Left shoulder pain for the past 3 days. Left arm tingling and numbness. EXAM: CERVICAL SPINE - COMPLETE 4+ VIEW COMPARISON:  None. FINDINGS: Mild reversal of the normal lordosis in the lower cervical spine. Minimal anterior and posterior spur formation at the C4-5 and C5-6 levels. Mild uncinate spur formation on the left at the C5-6 level causing minimal foraminal stenosis. Mild uncinate spur formation on the right at the C3-4 level with mild-to-moderate foraminal stenosis. There is also a combination of probable mild uncinate spur formation and mild to moderate facet hypertrophy producing mild-to-moderate foraminal stenosis on the right at the C4-5 level. IMPRESSION: Mild degenerative changes, as described above. These include mild uncinate spur formation on the left at the C5-6 level, causing minimal foraminal stenosis. Electronically Signed   By: Beckie Salts M.D.   On: 12/07/2016 17:03     Visual Acuity Review  Right Eye Distance:   Left Eye Distance:   Bilateral Distance:    Right Eye Near:   Left Eye Near:    Bilateral Near:    Medications  ketorolac (TORADOL) injection 60 mg (60 mg Intramuscular Given 12/07/16 1638)  Patient states he did receive relief with the injection    MDM   1. Cervical radiculopathy    New Prescriptions   METAXALONE (SKELAXIN) 800 MG TABLET    Take 1 tablet (800 mg total) by mouth 3 (three) times daily.   NAPROXEN (NAPROSYN) 500 MG TABLET    Take 1 tablet (500 mg total) by mouth 2 (two) times daily with a meal.  Plan: 1. Test/x-ray results and diagnosis reviewed with patient 2. rx as per orders; risks, benefits, potential side effects reviewed with patient 3. Recommend supportive treatment with Rest and symptom avoidance. Caution regarding use of Skelaxin with  activities requiring concentration and judgment. Do not drive while taking the medication. Also cautioned to take Naprosyn on with food. If he is not improving should follow-up with primary at [facility] primary. 4. F/u prn if symptoms worsen or don't improve     Lutricia Feil, PA-C 12/09/16 1502

## 2016-12-07 NOTE — ED Triage Notes (Signed)
Per patient left shoulder pain x 3 days . Per patient no injury to his left arm / shoulder.

## 2016-12-07 NOTE — Discharge Instructions (Signed)
Use ice 20 minutes out of every 2 hours 4 times daily.

## 2017-01-10 ENCOUNTER — Telehealth: Payer: Self-pay | Admitting: Family Medicine

## 2017-01-10 NOTE — Telephone Encounter (Signed)
Patient notified will need fasting labs first

## 2017-01-10 NOTE — Telephone Encounter (Signed)
Patient is requesting a refill on atorvastatin 40 mg to be sent to his mail order pharmacy ([facility]). Patient would like to the refills to be set up just like his blood pressure medication (# refills ).

## 2017-01-13 ENCOUNTER — Other Ambulatory Visit: Payer: Self-pay | Admitting: Family Medicine

## 2017-01-14 LAB — LIPID PANEL
Cholesterol: 180 (ref 0–200)
HDL: 76 — AB (ref 35–70)
LDL CALC: 85
Triglycerides: 94 (ref 40–160)

## 2017-01-14 LAB — BASIC METABOLIC PANEL
Creatinine: 1 (ref 0.6–1.3)
Glucose: 133

## 2017-01-14 LAB — HEMOGLOBIN A1C: HEMOGLOBIN A1C: 5.4

## 2017-01-14 NOTE — Telephone Encounter (Signed)
I'll refill BP meds as requested Please remind patient that we had hoped to check his fasting cholesterol and liver enzyme in early to mid-May Ask him to have these done at his earliest convenience Thank you

## 2017-01-17 ENCOUNTER — Other Ambulatory Visit: Payer: Self-pay

## 2017-01-17 ENCOUNTER — Other Ambulatory Visit: Payer: Self-pay | Admitting: Family Medicine

## 2017-01-17 ENCOUNTER — Telehealth: Payer: Self-pay

## 2017-01-17 DIAGNOSIS — Z5181 Encounter for therapeutic drug level monitoring: Secondary | ICD-10-CM

## 2017-01-17 DIAGNOSIS — E785 Hyperlipidemia, unspecified: Secondary | ICD-10-CM

## 2017-01-17 NOTE — Telephone Encounter (Addendum)
Patient brought up lab work from [place] at work and needs refill of cholesterol medication. Entered Cholesterol Labs under Quick Abstract.

## 2017-01-17 NOTE — Telephone Encounter (Signed)
Left detailed voicemail

## 2017-01-18 MED ORDER — ATORVASTATIN CALCIUM 40 MG PO TABS: 40.0000 mg | ORAL_TABLET | Freq: Every day | ORAL | 6 refills | Status: DC

## 2017-01-18 NOTE — Telephone Encounter (Signed)
Lab Results  Component Value Date   CHOL 180 01/14/2017   HDL 76 (A) 01/14/2017   LDLCALC 85 01/14/2017   TRIG 94 01/14/2017   CHOLHDL 3.8 10/14/2016   Thank you Rx approved

## 2017-01-19 ENCOUNTER — Encounter: Payer: Self-pay | Admitting: Family Medicine

## 2017-01-19 ENCOUNTER — Ambulatory Visit: Payer: 59

## 2017-01-19 DIAGNOSIS — I10 Essential (primary) hypertension: Secondary | ICD-10-CM

## 2017-02-22 ENCOUNTER — Other Ambulatory Visit: Payer: Self-pay | Admitting: Physical Medicine and Rehabilitation

## 2017-02-22 ENCOUNTER — Encounter: Payer: Self-pay | Admitting: Radiology

## 2017-02-22 DIAGNOSIS — M5412 Radiculopathy, cervical region: Secondary | ICD-10-CM

## 2017-03-01 ENCOUNTER — Ambulatory Visit
Admission: RE | Admit: 2017-03-01 | Discharge: 2017-03-01 | Disposition: A | Discharge status: Home / Self Care | Payer: No Typology Code available for payment source | Source: Ambulatory Visit | Attending: Physical Medicine and Rehabilitation | Admitting: Physical Medicine and Rehabilitation

## 2017-03-01 ENCOUNTER — Ambulatory Visit: Payer: No Typology Code available for payment source

## 2017-03-01 ENCOUNTER — Other Ambulatory Visit: Payer: Self-pay | Admitting: Physical Medicine and Rehabilitation

## 2017-03-01 ENCOUNTER — Ambulatory Visit: Payer: 59

## 2017-03-01 DIAGNOSIS — M5030 Other cervical disc degeneration, unspecified cervical region: Secondary | ICD-10-CM | POA: Diagnosis not present

## 2017-03-01 DIAGNOSIS — M47812 Spondylosis without myelopathy or radiculopathy, cervical region: Secondary | ICD-10-CM | POA: Insufficient documentation

## 2017-03-01 DIAGNOSIS — M2578 Osteophyte, vertebrae: Secondary | ICD-10-CM | POA: Insufficient documentation

## 2017-03-01 DIAGNOSIS — M5412 Radiculopathy, cervical region: Secondary | ICD-10-CM

## 2017-03-01 DIAGNOSIS — M5021 Other cervical disc displacement,  high cervical region: Secondary | ICD-10-CM | POA: Insufficient documentation

## 2017-06-28 ENCOUNTER — Other Ambulatory Visit: Payer: Self-pay | Admitting: Family Medicine

## 2017-07-09 ENCOUNTER — Ambulatory Visit
Admission: EM | Admit: 2017-07-09 | Discharge: 2017-07-09 | Disposition: A | Discharge status: Home / Self Care | Payer: No Typology Code available for payment source | Attending: Emergency Medicine | Admitting: Emergency Medicine

## 2017-07-09 ENCOUNTER — Other Ambulatory Visit: Payer: Self-pay

## 2017-07-09 DIAGNOSIS — J069 Acute upper respiratory infection, unspecified: Secondary | ICD-10-CM | POA: Diagnosis not present

## 2017-07-09 DIAGNOSIS — B9789 Other viral agents as the cause of diseases classified elsewhere: Secondary | ICD-10-CM

## 2017-07-09 DIAGNOSIS — Z889 Allergy status to unspecified drugs, medicaments and biological substances status: Secondary | ICD-10-CM | POA: Diagnosis not present

## 2017-07-09 MED ORDER — OXYMETAZOLINE HCL 0.05 % NA SOLN: 1.0000 | Freq: Two times a day (BID) | NASAL | 0 refills | Status: DC

## 2017-07-09 MED ORDER — BENZONATATE 100 MG PO CAPS: 100.0000 mg | ORAL_CAPSULE | Freq: Three times a day (TID) | ORAL | 0 refills | Status: DC

## 2017-07-09 NOTE — ED Triage Notes (Signed)
Patient complains of cough, congestion, runny nose, chills x off and on for 2 months. Patient states that symptoms worsened over last week. Patient reports that he has been having mucus productivity with cough. Patient also reports runny nose and bodyaches.

## 2017-07-09 NOTE — ED Provider Notes (Signed)
[facility]    CSN: [id number] Arrival date & time: 07/09/17  1513     History   Chief Complaint Chief Complaint  Patient presents with  . Cough    HPI Mark Lucas is a 46 y.o. male.   46 yr old white male presents to ER with cough, runny nose, chest congestion, stuffy nose intermittent, taking allergy meds for multiple allergies(ENT supervises). Pt denies fever.    The history is provided by the patient. No language interpreter was used.    Past Medical History:  Diagnosis Date  . GERD (gastroesophageal reflux disease)   . Hyperlipemia   . Hypertension     Patient Active Problem List   Diagnosis Date Noted  . Viral URI with cough 07/09/2017  . Multiple allergies 07/09/2017    Past Surgical History:  Procedure Laterality Date  . CATARACT EXTRACTION    . LASIK         Home Medications    Prior to Admission medications   Medication Sig Start Date End Date Taking? Authorizing Provider  bisoprolol-hydrochlorothiazide (ZIAC) 2.5-6.25 MG per tablet Take 1 tablet by mouth daily.   Yes [provider]  cimetidine (TAGAMET) 200 MG tablet Take 200 mg by mouth 2 (two) times daily.   Yes [provider]  fexofenadine (ALLEGRA) 180 MG tablet Take 180 mg by mouth daily.   Yes [provider]  lisinopril (PRINIVIL,ZESTRIL) 20 MG tablet Take 20 mg by mouth daily.   Yes [provider]  lovastatin (MEVACOR) 20 MG tablet Take 20 mg by mouth at bedtime.   Yes [provider]  naproxen (NAPROSYN) 500 MG tablet Take 1 tablet (500 mg total) by mouth 2 (two) times daily with a meal. 12/07/16  Yes Lutricia Feiloemer, William P, PA-C  omeprazole (PRILOSEC) 20 MG capsule Take 20 mg by mouth daily.   Yes [provider]  benzonatate (TESSALON) 100 MG capsule Take 1 capsule (100 mg total) by mouth every 8 (eight) hours. 07/09/17   Ameirah Khatoon, Para MarchJeanette, NP  metaxalone (SKELAXIN) 800 MG tablet Take 1 tablet (800 mg total) by mouth  3 (three) times daily. 12/07/16   Lutricia Feiloemer, William P, PA-C  oxymetazoline (AFRIN NASAL SPRAY) 0.05 % nasal spray Place 1 spray into both nostrils 2 (two) times daily. 07/09/17   Chynna Buerkle, Para MarchJeanette, NP    Family History Family History  Problem Relation Age of Onset  . Cancer Mother   . Heart disease Father     Social History Social History   Tobacco Use  . Smoking status: Former Games developermoker  . Smokeless tobacco: Former Engineer, waterUser  Substance Use Topics  . Alcohol use: Yes    Alcohol/week: 0.0 oz    Comment: occasionally  . Drug use: No     Allergies   Iodinated diagnostic agents   Review of Systems Review of Systems  Constitutional: Negative for chills and fever.  HENT: Positive for congestion, postnasal drip and sinus pressure. Negative for sore throat.   Eyes: Negative.   Respiratory: Positive for cough. Negative for shortness of breath.   Gastrointestinal: Negative for abdominal pain, nausea and vomiting.  Endocrine: Negative.   Genitourinary: Negative for dysuria.  Musculoskeletal: Negative for back pain.  Skin: Negative for rash.  Allergic/Immunologic: Negative.   Neurological: Positive for headaches.  Hematological: Negative.   Psychiatric/Behavioral: Negative.   All other systems reviewed and are negative.    Physical Exam Triage Vital Signs ED Triage Vitals  Enc Vitals Group  BP 07/09/17 1533 126/67     Pulse Rate 07/09/17 1533 83     Resp 07/09/17 1533 18     Temp 07/09/17 1533 98.2 F (36.8 C)     Temp Source 07/09/17 1533 Oral     SpO2 07/09/17 1533 99 %     Weight 07/09/17 1531 215 lb (97.5 kg)     Height 07/09/17 1531 5\' 8"  (1.727 m)     Head Circumference --      Peak Flow --      Pain Score 07/09/17 1531 3     Pain Loc --      Pain Edu? --      Excl. in GC? --    No data found.  Updated Vital Signs BP 126/67 (BP Location: Right Arm)   Pulse 83   Temp 98.2 F (36.8 C) (Oral)   Resp 18   Ht 5\' 8"  (1.727 m)   Wt 215 lb (97.5 kg)   SpO2 99%    BMI 32.69 kg/m   Visual Acuity Right Eye Distance:   Left Eye Distance:   Bilateral Distance:    Right Eye Near:   Left Eye Near:    Bilateral Near:     Physical Exam  Constitutional: He is oriented to person, place, and time. He appears well-developed and well-nourished. He is active and cooperative.  Non-toxic appearance. He does not have a sickly appearance. He does not appear ill. No distress.  HENT:  Head: Normocephalic.  Right Ear: Tympanic membrane is retracted.  Left Ear: Tympanic membrane is retracted.  Nose: Mucosal edema present.  Mouth/Throat: Uvula is midline, oropharynx is clear and moist and mucous membranes are normal.  Eyes: Conjunctivae, EOM and lids are normal. Pupils are equal, round, and reactive to light.  Neck: Normal range of motion.  Cardiovascular: Normal rate, regular rhythm, normal heart sounds and normal pulses.  Pulmonary/Chest: Effort normal and breath sounds normal. No respiratory distress. He has no decreased breath sounds. He has no wheezes.  Abdominal: Soft. He exhibits no distension.  Musculoskeletal: Normal range of motion.  Neurological: He is alert and oriented to person, place, and time. No cranial nerve deficit or sensory deficit. GCS eye subscore is 4. GCS verbal subscore is 5. GCS motor subscore is 6.  Skin: Skin is warm, dry and intact. No rash noted.  Psychiatric: He has a normal mood and affect. His speech is normal and behavior is normal.  Nursing note and vitals reviewed.    UC Treatments / Results  Labs (all labs ordered are listed, but only abnormal results are displayed) Labs Reviewed - No data to display  EKG  EKG Interpretation None       Radiology No results found.  Procedures Procedures (including critical care time)  Medications Ordered in UC Medications - No data to display   Initial Impression / Assessment and Plan / UC Course  I have reviewed the triage vital signs and the nursing notes.  Pertinent  labs & imaging results that were available during my care of the patient were reviewed by me and considered in my medical decision making (see chart for details).     Rest,push fluids, take allergy meds as directed. Use tessalon and Afrin as directed. Follow up with PCP for recheck, go to Er for new or worsening issues. Pt verbalized understanding to this provider.   Final Clinical Impressions(s) / UC Diagnoses   Final diagnoses:  Viral URI with cough  Multiple allergies  ED Discharge Orders        Ordered    benzonatate (TESSALON) 100 MG capsule  Every 8 hours     07/09/17 1615    oxymetazoline (AFRIN NASAL SPRAY) 0.05 % nasal spray  2 times daily     07/09/17 1615       Controlled Substance Prescriptions    Gregori Abril, Para MarchJeanette, NP 07/09/17 1636

## 2017-07-09 NOTE — Discharge Instructions (Addendum)
Rest,push fluids, take allergy meds as directed. Use tessalon and Afrin as directed. Follow up with PCP for recheck, go tot Er for new or worsening issues.

## 2017-12-02 ENCOUNTER — Telehealth: Payer: Self-pay | Admitting: Family Medicine

## 2017-12-04 NOTE — Telephone Encounter (Signed)
Patient's last visit was March of 2018 Please have him schedule follow-up appt in the next 30 days; thank you (He'll need to be seen for further refills. I did send in refill this time.)

## 2017-12-05 NOTE — Telephone Encounter (Signed)
Spoke with pt and appt made for 01-10-18. Pt also informed that prescription has been sent to pharmacy

## 2018-01-10 ENCOUNTER — Encounter: Payer: Self-pay | Admitting: Family Medicine

## 2018-01-10 ENCOUNTER — Ambulatory Visit: Payer: 59 | Admitting: Family Medicine

## 2018-01-10 VITALS — BP 136/80 | HR 94 | Temp 97.9°F | Resp 14 | Ht 68.0 in | Wt 187.5 lb

## 2018-01-10 DIAGNOSIS — I10 Essential (primary) hypertension: Secondary | ICD-10-CM

## 2018-01-10 DIAGNOSIS — Z23 Encounter for immunization: Secondary | ICD-10-CM | POA: Diagnosis not present

## 2018-01-10 DIAGNOSIS — E785 Hyperlipidemia, unspecified: Secondary | ICD-10-CM | POA: Diagnosis not present

## 2018-01-10 DIAGNOSIS — R739 Hyperglycemia, unspecified: Secondary | ICD-10-CM | POA: Diagnosis not present

## 2018-01-10 DIAGNOSIS — E663 Overweight: Secondary | ICD-10-CM | POA: Diagnosis not present

## 2018-01-10 DIAGNOSIS — Z5181 Encounter for therapeutic drug level monitoring: Secondary | ICD-10-CM

## 2018-01-10 MED ORDER — AMLODIPINE BESYLATE 5 MG PO TABS: 5.0000 mg | ORAL_TABLET | Freq: Every day | ORAL | 3 refills | Status: DC

## 2018-01-10 MED ORDER — FLUTICASONE PROPIONATE 50 MCG/ACT NA SUSP: 2.0000 | NASAL | 3 refills | Status: DC | PRN

## 2018-01-10 MED ORDER — HYDROCHLOROTHIAZIDE 25 MG PO TABS: 25.0000 mg | ORAL_TABLET | Freq: Every day | ORAL | 3 refills | Status: DC

## 2018-01-10 MED ORDER — TETANUS-DIPHTH-ACELL PERTUSSIS 5-2.5-18.5 LF-MCG/0.5 IM SUSP
0.5000 mL | Freq: Once | INTRAMUSCULAR | Status: AC
  Administered 2018-01-10: 0.5 mL via INTRAMUSCULAR

## 2018-01-10 MED ORDER — ATORVASTATIN CALCIUM 40 MG PO TABS: 40.0000 mg | ORAL_TABLET | Freq: Every day | ORAL | 3 refills | Status: AC

## 2018-01-10 NOTE — Patient Instructions (Addendum)
Check out the information at familydoctor.org entitled "Nutrition for Weight Loss: What You Need to Know about Fad Diets" Try to lose between 0.5 to 1 pound per week by taking in fewer calories and burning off more calories You can succeed by limiting portions, limiting foods dense in calories and fat, becoming more active, and drinking 8 glasses of water a day (64 ounces) Don't skip meals, especially breakfast, as skipping meals may alter your metabolism Do not use over-the-counter weight loss pills or gimmicks that claim rapid weight loss A healthy BMI (or body mass index) is between 18.5 and 24.9 You can calculate your ideal BMI at the [place] website [employer]Monetize[employer]fr Try to follow the DASH guidelines (DASH stands for Dietary Approaches to Stop Hypertension). Try to limit the sodium in your diet to no more than 1,500mg  of sodium per day. Certainly try to not exceed 2,000 mg per day at the very most. Do not add salt when cooking or at the table.  Check the sodium amount on labels when shopping, and choose items lower in sodium when given a choice. Avoid or limit foods that already contain a lot of sodium. Eat a diet rich in fruits and vegetables and whole grains, and try to lose weight if overweight or obese Try to limit saturated fats in your diet (bologna, hot dogs, barbeque, cheeseburgers, hamburgers, steak, bacon, sausage, cheese, etc.) and get more fresh fruits, vegetables, and whole grains

## 2018-01-10 NOTE — Progress Notes (Signed)
BP 136/80   Pulse 94   Temp 97.9 F (36.6 C) (Oral)   Resp 14   Ht 5\' 8"  (1.727 m)   Wt 187 lb 8 oz (85 kg)   SpO2 98%   BMI 28.51 kg/m    Subjective:    Patient ID: Andrew Bond, male    DOB: 01/02/1971, 47 y.o.   MRN: [id number]  HPI: Andrew Bond is a 47 y.o. male  Chief Complaint  Patient presents with  . Medication Refill  . Follow-up    HPI Patient is here for f/u; needs medicine refill  He has hypertension; taking HCTZ and amlodipine; adds salt just once in a while; eats out some, just once a week; not paying to sodium on labels or at fast food restaurants; avoiding salty chicken nuggets, cannot tolerate  High cholesterol; taking atorvastatin 40 mg; no abd pain or nausea; hard time to remember at night and okay to take during day; not many eggs, just on the weekends Lab Results  Component Value Date   CHOL 180 01/14/2017   HDL 76 (A) 01/14/2017   LDLCALC 85 01/14/2017   TRIG 94 01/14/2017   CHOLHDL 3.8 10/14/2016   Allergic rhinitis; using allegra and flonase  GERD; doing okay; no abd pain, no blood in the stool  Hx of hyperglycemia; not fasting; not strong family hx of diabetes  Depression screen [facility] 2/9 01/10/2018 10/14/2016 04/16/2016  Decreased Interest 0 0 0  Down, Depressed, Hopeless 0 0 0  PHQ - 2 Score 0 0 0    Relevant past medical, surgical, family and social history reviewed Past Medical History:  Diagnosis Date  . Allergic rhinitis due to pollen 04/01/2015  . Dyslipidemia 04/22/2015  . Essential hypertension, benign 04/01/2015  . Hyperglycemia 04/16/2016   Past Surgical History:  Procedure Laterality Date  . HERNIA REPAIR     x6  . NM RENAL LASIX ([place] HX)    . REFRACTIVE SURGERY    . TOE SURGERY    . WRIST SURGERY Bilateral    carpal tunnel   Family History  Problem Relation Age of Onset  . Hypertension Mother   . Cancer Mother        cervical  . Heart disease Father   . Heart attack Father   .  Hypertension Brother   . Hyperlipidemia Brother   . Cancer Maternal Grandmother        lymph nodes, spread to lungs   Social History   Tobacco Use  . Smoking status: Former Smoker    Packs/day: 0.50    Years: 10.00    Pack years: 5.00    Types: Cigarettes    Last attempt to quit: 08/02/2014    Years since quitting: 3.4  . Smokeless tobacco: Never Used  Substance Use Topics  . Alcohol use: No  . Drug use: No    Interim medical history since last visit reviewed. Allergies and medications reviewed  Review of Systems Per HPI unless specifically indicated above     Objective:    BP 136/80   Pulse 94   Temp 97.9 F (36.6 C) (Oral)   Resp 14   Ht 5\' 8"  (1.727 m)   Wt 187 lb 8 oz (85 kg)   SpO2 98%   BMI 28.51 kg/m   Wt Readings from Last 3 Encounters:  01/10/18 187 lb 8 oz (85 kg)  10/14/16 180 lb 3.2 oz (81.7 kg)  04/16/16 182 lb (82.6 kg)  Physical Exam  Constitutional: He appears well-developed and well-nourished. No distress.  HENT:  Head: Normocephalic and atraumatic.  Eyes: EOM are normal. No scleral icterus.  Neck: No thyromegaly present.  Cardiovascular: Normal rate and regular rhythm.  Pulmonary/Chest: Effort normal and breath sounds normal.  Abdominal: Soft. Bowel sounds are normal. He exhibits no distension.  Musculoskeletal: He exhibits no edema.  Neurological: Coordination normal.  Skin: Skin is warm and dry. No pallor.  Psychiatric: He has a normal mood and affect. His behavior is normal. Judgment and thought content normal.    Results for orders placed or performed in visit on 16/10/96  Basic metabolic panel  Result Value Ref Range   Glucose 133    Creatinine 1.0 0.6 - 1.3  Lipid panel  Result Value Ref Range   Triglycerides 94 40 - 160   Cholesterol 180 0 - 200   HDL 76 (A) 35 - 70   LDL Cholesterol 85   Hemoglobin A1c  Result Value Ref Range   Hemoglobin A1C 5.4       Assessment & Plan:   Problem List Items Addressed This Visit        Cardiovascular and Mediastinum   Essential hypertension, benign - Primary (Chronic)    Encouraged weight loss, DASH guidelines      Relevant Medications   atorvastatin (LIPITOR) 40 MG tablet   amLODipine (NORVASC) 5 MG tablet   hydrochlorothiazide (HYDRODIURIL) 25 MG tablet     Other   Medication monitoring encounter   Relevant Orders   Comprehensive metabolic panel   Overweight (BMI 25.0-29.9)    Encouraged modest weight loss      Hyperglycemia    Encouraged modest weight loss; check glucose and A1c      Relevant Orders   Hemoglobin A1c   Dyslipidemia (Chronic)    Encouraged weight loss, healthy eating; check lipids      Relevant Medications   atorvastatin (LIPITOR) 40 MG tablet   Other Relevant Orders   Lipid panel    Other Visit Diagnoses    Need for Tdap vaccination       Relevant Medications   Tdap (BOOSTRIX) injection 0.5 mL       Follow up plan: Return in about 1 year (around 01/11/2019) for follow-up visit with Dr. Sanda Klein; physical in 6 months .  An after-visit summary was printed and given to the patient at [place].  Please see the patient instructions which may contain other information and recommendations beyond what is mentioned above in the assessment and plan.  Meds ordered this encounter  Medications  . Tdap (BOOSTRIX) injection 0.5 mL  . atorvastatin (LIPITOR) 40 MG tablet    Sig: Take 1 tablet (40 mg total) by mouth at bedtime.    Dispense:  90 tablet    Refill:  3  . amLODipine (NORVASC) 5 MG tablet    Sig: Take 1 tablet (5 mg total) by mouth daily.    Dispense:  90 tablet    Refill:  3  . fluticasone (FLONASE) 50 MCG/ACT nasal spray    Sig: Place 2 sprays into both nostrils as needed.    Dispense:  48 g    Refill:  3  . hydrochlorothiazide (HYDRODIURIL) 25 MG tablet    Sig: Take 1 tablet (25 mg total) by mouth daily.    Dispense:  90 tablet    Refill:  3    Orders Placed This Encounter  Procedures  . Comprehensive metabolic  panel  . Hemoglobin  A1c  . Lipid panel

## 2018-01-10 NOTE — Assessment & Plan Note (Signed)
Encouraged modest weight loss; check glucose and A1c

## 2018-01-10 NOTE — Assessment & Plan Note (Signed)
Encouraged weight loss, healthy eating; check lipids

## 2018-01-10 NOTE — Assessment & Plan Note (Signed)
Encouraged weight loss, DASH guidelines

## 2018-01-10 NOTE — Assessment & Plan Note (Signed)
Encouraged modest weight loss 

## 2018-01-12 ENCOUNTER — Other Ambulatory Visit: Payer: Self-pay | Admitting: Family Medicine

## 2018-01-12 MED ORDER — FLUTICASONE PROPIONATE 50 MCG/ACT NA SUSP: 2.0000 | Freq: Every day | NASAL | 3 refills | Status: DC | PRN

## 2018-01-12 NOTE — Progress Notes (Signed)
Daily prn instructions added, new Rx sent

## 2018-02-17 ENCOUNTER — Other Ambulatory Visit: Payer: Self-pay

## 2018-02-17 ENCOUNTER — Ambulatory Visit
Admission: EM | Admit: 2018-02-17 | Discharge: 2018-02-17 | Disposition: A | Discharge status: Home / Self Care | Payer: BLUE CROSS/BLUE SHIELD | Attending: Family Medicine | Admitting: Family Medicine

## 2018-02-17 DIAGNOSIS — L03115 Cellulitis of right lower limb: Secondary | ICD-10-CM | POA: Diagnosis not present

## 2018-02-17 MED ORDER — DOXYCYCLINE HYCLATE 100 MG PO CAPS: 100.0000 mg | ORAL_CAPSULE | Freq: Two times a day (BID) | ORAL | 0 refills | Status: DC

## 2018-02-17 NOTE — ED Provider Notes (Signed)
[facility]    CSN: [id number] Arrival date & time: 02/17/18  1559   History   Chief Complaint Chief Complaint  Patient presents with  . Cellulitis   HPI  47 year old male presents for evaluation of redness above his right ankle.  Patient reports that he noticed redness of his right lower leg above the ankle on Sunday.  Occurred after mowing grass.  No reported injury, trauma.  He states that he iced the area without resolution.  Redness has continued.  He now has pain and tenderness to the touch.  He is unsure of the etiology.  No fevers or chills.  No medications tried.  His pain is moderate in severity.  No other associated symptoms.  No other complaints.  Past Medical History:  Diagnosis Date  . GERD (gastroesophageal reflux disease)   . Hyperlipemia   . Hypertension    Patient Active Problem List   Diagnosis Date Noted  . Viral URI with cough 07/09/2017  . Multiple allergies 07/09/2017   Past Surgical History:  Procedure Laterality Date  . CATARACT EXTRACTION    . LASIK     Home Medications    Prior to Admission medications   Medication Sig Start Date End Date Taking? Authorizing Provider  bisoprolol-hydrochlorothiazide (ZIAC) 2.5-6.25 MG per tablet Take 1 tablet by mouth daily.   Yes [provider]  cimetidine (TAGAMET) 200 MG tablet Take 200 mg by mouth 2 (two) times daily.   Yes [provider]  lisinopril (PRINIVIL,ZESTRIL) 20 MG tablet Take 20 mg by mouth daily.   Yes [provider]  lovastatin (MEVACOR) 20 MG tablet Take 20 mg by mouth at bedtime.   Yes [provider]  montelukast (SINGULAIR) 10 MG tablet Take by mouth. 04/15/16  Yes [provider]  doxycycline (VIBRAMYCIN) 100 MG capsule Take 1 capsule (100 mg total) by mouth 2 (two) times daily. 02/17/18   Tommie Sams, DO    Family History Family History  Problem Relation Age of Onset  . Cancer Mother   . Heart disease Father     Social  History Social History   Tobacco Use  . Smoking status: Former Games developer  . Smokeless tobacco: Former Engineer, water Use Topics  . Alcohol use: Yes    Alcohol/week: 0.0 oz    Comment: occasionally  . Drug use: No     Allergies   Iodinated diagnostic agents   Review of Systems Review of Systems  Constitutional: Negative.   Skin:       Redness, pain, warmth.   Physical Exam Triage Vital Signs ED Triage Vitals  Enc Vitals Group     BP 02/17/18 1617 128/80     Pulse Rate 02/17/18 1617 100     Resp 02/17/18 1617 17     Temp 02/17/18 1617 98.2 F (36.8 C)     Temp Source 02/17/18 1617 Oral     SpO2 02/17/18 1617 97 %     Weight 02/17/18 1614 230 lb (104.3 kg)     Height 02/17/18 1614 5\' 8"  (1.727 m)     Head Circumference --      Peak Flow --      Pain Score 02/17/18 1614 6     Pain Loc --      Pain Edu? --      Excl. in GC? --    Updated Vital Signs BP 128/80 (BP Location: Left Arm)   Pulse 100   Temp 98.2 F (36.8  C) (Oral)   Resp 17   Ht 5\' 8"  (1.727 m)   Wt 230 lb (104.3 kg)   SpO2 97%   BMI 34.97 kg/m   Physical Exam  Constitutional: He is oriented to person, place, and time. He appears well-developed. No distress.  HENT:  Head: Normocephalic and atraumatic.  Cardiovascular:  Tachycardia.  Regular rhythm.  Pulmonary/Chest: Effort normal and breath sounds normal.  Neurological: He is alert and oriented to person, place, and time.  Skin:     Area of erythema, warmth, and tenderness to palpation at the labeled location.  Psychiatric: He has a normal mood and affect. His behavior is normal.  Nursing note and vitals reviewed.  UC Treatments / Results  Labs (all labs ordered are listed, but only abnormal results are displayed) Labs Reviewed - No data to display  EKG None  Radiology No results found.  Procedures Procedures (including critical care time)  Medications Ordered in UC Medications - No data to display  Initial Impression /  Assessment and Plan / UC Course  I have reviewed the triage vital signs and the nursing notes.  Pertinent labs & imaging results that were available during my care of the patient were reviewed by me and considered in my medical decision making (see chart for details).    47 year old male presents with cellulitis.  Treating with doxycycline.  Final Clinical Impressions(s) / UC Diagnoses   Final diagnoses:  Cellulitis of right lower extremity   Discharge Instructions   None    ED Prescriptions    Medication Sig Dispense Auth. Provider   doxycycline (VIBRAMYCIN) 100 MG capsule Take 1 capsule (100 mg total) by mouth 2 (two) times daily. 14 capsule Tommie Samsook, Aishwarya Shiplett G, DO     Controlled Substance Prescriptions [place] Controlled Substance Registry consulted? Not Applicable   Tommie SamsCook, Bathsheba Durrett G, DO 02/17/18 1725

## 2018-02-17 NOTE — ED Triage Notes (Signed)
Patient complains of right ankle cellulitis that started on Sunday after mowing his grass. Patient states that area is painful.

## 2018-02-20 ENCOUNTER — Encounter: Payer: Self-pay | Admitting: Gynecology

## 2018-02-20 ENCOUNTER — Ambulatory Visit
Admission: EM | Admit: 2018-02-20 | Discharge: 2018-02-20 | Disposition: A | Discharge status: Other Acute Inpatient Hospital | Payer: BLUE CROSS/BLUE SHIELD | Attending: Family Medicine | Admitting: Family Medicine

## 2018-02-20 DIAGNOSIS — L03115 Cellulitis of right lower limb: Secondary | ICD-10-CM | POA: Diagnosis not present

## 2018-02-20 NOTE — ED Provider Notes (Signed)
[facility]    CSN: [id number] Arrival date & time: 02/20/18  1022     History   Chief Complaint Chief Complaint  Patient presents with  . Cellulitis    HPI Mark Lucas is a 47 y.o. male.   47 yo male with a c/o worsening right leg swelling, redness and pain. Patient was seen about 4 days ago, diagnosed with cellulitis and started on oral antibiotic. Patient states symptoms have worsened.   The history is provided by the patient.    Past Medical History:  Diagnosis Date  . GERD (gastroesophageal reflux disease)   . Hyperlipemia   . Hypertension     Patient Active Problem List   Diagnosis Date Noted  . Viral URI with cough 07/09/2017  . Multiple allergies 07/09/2017    Past Surgical History:  Procedure Laterality Date  . CATARACT EXTRACTION    . LASIK         Home Medications    Prior to Admission medications   Medication Sig Start Date End Date Taking? Authorizing Provider  bisoprolol-hydrochlorothiazide (ZIAC) 2.5-6.25 MG per tablet Take 1 tablet by mouth daily.   Yes [provider]  cimetidine (TAGAMET) 200 MG tablet Take 200 mg by mouth 2 (two) times daily.   Yes [provider]  doxycycline (VIBRAMYCIN) 100 MG capsule Take 1 capsule (100 mg total) by mouth 2 (two) times daily. 02/17/18  Yes Cook, Jayce G, DO  lisinopril (PRINIVIL,ZESTRIL) 20 MG tablet Take 20 mg by mouth daily.   Yes [provider]  lovastatin (MEVACOR) 20 MG tablet Take 20 mg by mouth at bedtime.   Yes [provider]  montelukast (SINGULAIR) 10 MG tablet Take by mouth. 04/15/16  Yes [provider]    Family History Family History  Problem Relation Age of Onset  . Cancer Mother   . Heart disease Father     Social History Social History   Tobacco Use  . Smoking status: Former Games developer  . Smokeless tobacco: Former Engineer, water Use Topics  . Alcohol use: Yes    Alcohol/week: 0.0 oz    Comment: occasionally    . Drug use: No     Allergies   Iodinated diagnostic agents   Review of Systems Review of Systems   Physical Exam Triage Vital Signs ED Triage Vitals  Enc Vitals Group     BP 02/20/18 1032 124/87     Pulse Rate 02/20/18 1032 100     Resp 02/20/18 1032 16     Temp 02/20/18 1032 99.7 F (37.6 C)     Temp Source 02/20/18 1032 Oral     SpO2 02/20/18 1032 97 %     Weight 02/20/18 1030 230 lb (104.3 kg)     Height 02/20/18 1030 5\' 8"  (1.727 m)     Head Circumference --      Peak Flow --      Pain Score 02/20/18 1030 3     Pain Loc --      Pain Edu? --      Excl. in GC? --    No data found.  Updated Vital Signs BP 124/87 (BP Location: Left Arm)   Pulse 100   Temp 99.7 F (37.6 C) (Oral)   Resp 16   Ht 5\' 8"  (1.727 m)   Wt 230 lb (104.3 kg)   SpO2 97%   BMI 34.97 kg/m   Visual Acuity Right Eye Distance:   Left Eye Distance:  Bilateral Distance:    Right Eye Near:   Left Eye Near:    Bilateral Near:     Physical Exam  Constitutional: He appears well-developed and well-nourished. No distress.  Skin: He is not diaphoretic. There is erythema (right shin area and foot with edema, warmth and tenderness to palpation).  Nursing note and vitals reviewed.    UC Treatments / Results  Labs (all labs ordered are listed, but only abnormal results are displayed) Labs Reviewed - No data to display  EKG None  Radiology No results found.  Procedures Procedures (including critical care time)  Medications Ordered in UC Medications - No data to display  Initial Impression / Assessment and Plan / UC Course  I have reviewed the triage vital signs and the nursing notes.  Pertinent labs & imaging results that were available during my care of the patient were reviewed by me and considered in my medical decision making (see chart for details).      Final Clinical Impressions(s) / UC Diagnoses   Final diagnoses:  Cellulitis of right lower leg     Discharge  Instructions     Recommend patient go to emergency department for further evaluation and management due to failing outpatient oral antibiotic    ED Prescriptions    None     1. diagnosis reviewed with patient; due to patient's symptoms worsening despite oral antibiotic, recommend patient go to ED for further evaluation and management  Controlled Substance Prescriptions [place] Controlled Substance Registry consulted? Not Applicable   Payton Mccallumonty, Eldra Word, MD 02/20/18 [phone]

## 2018-02-20 NOTE — Discharge Instructions (Signed)
Recommend patient go to emergency department for further evaluation and management due to failing outpatient oral antibiotic

## 2018-02-20 NOTE — ED Triage Notes (Signed)
Patient present with right foot redness and swelling x 2 weeks ago. Per patient follow up visit was seen x 3 days ago.

## 2018-07-15 IMAGING — MR MR CERVICAL SPINE W/O CM
5 series · 35 of 48 positions shown · non-contrast
Comparison: Plain film cervical spine 12/07/2016.

CLINICAL DATA: Left shoulder pain for 3-4 months radiating into the
neck. No known injury.

EXAM:
MRI CERVICAL SPINE WITHOUT CONTRAST
TECHNIQUE: Multiplanar, multisequence MR imaging of the cervical spine was
performed. No intravenous contrast was administered.

[Series 3: T2 · sagittal · 3.0mm · 0.70mm/px · 8 of 15 slices shown (1 of 2)]
[im 1/15]
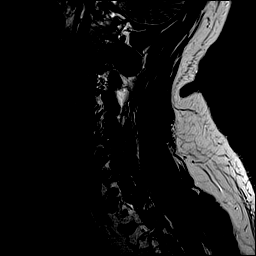
[im 3/15]
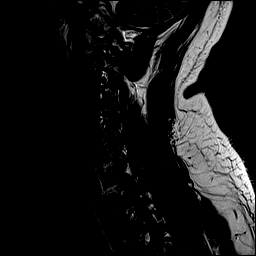
[im 5/15]
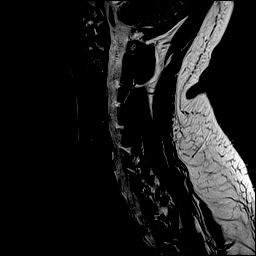
[im 7/15]
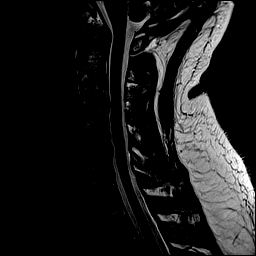
[im 9/15]
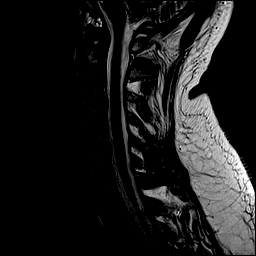
[im 11/15]
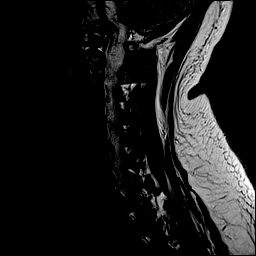
[im 13/15]
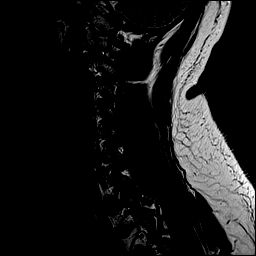
[im 15/15]
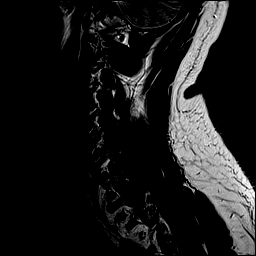

[Series 4: T1 · sagittal · 3.0mm · 0.70mm/px · 7 of 15 slices shown]
[im 1/15]
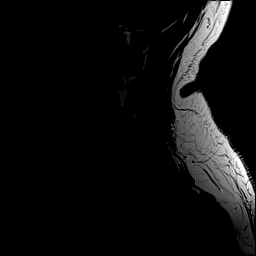
[im 3/15]
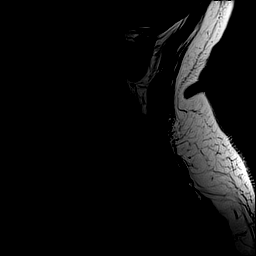
[im 5/15]
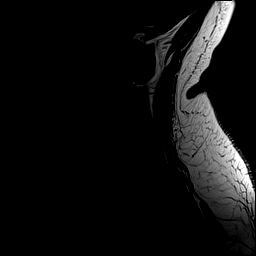
[im 8/15]
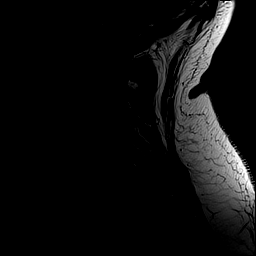
[im 10/15]
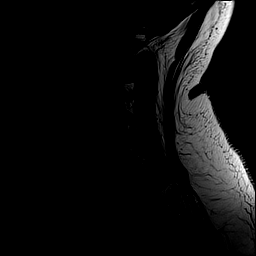
[im 12/15]
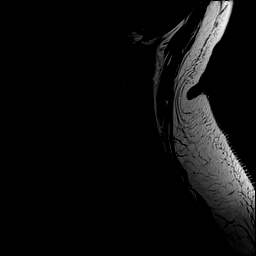
[im 15/15]
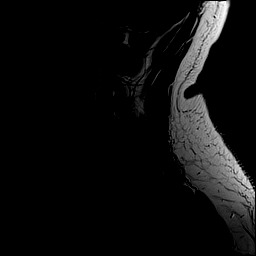

[Series 5: STIR · sagittal · 3.0mm · 0.35mm/px · 7 of 15 slices shown]
[im 1/15]
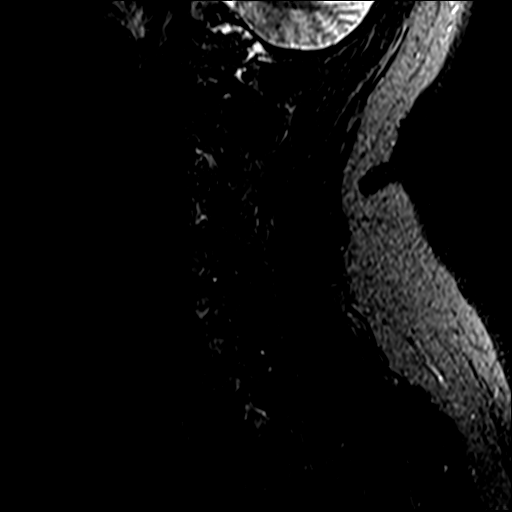
[im 3/15]
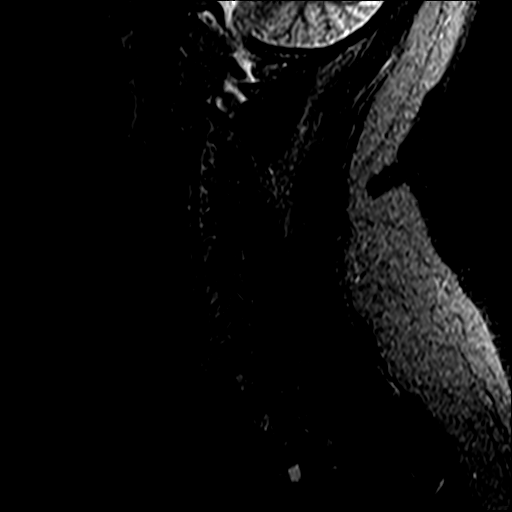
[im 5/15]
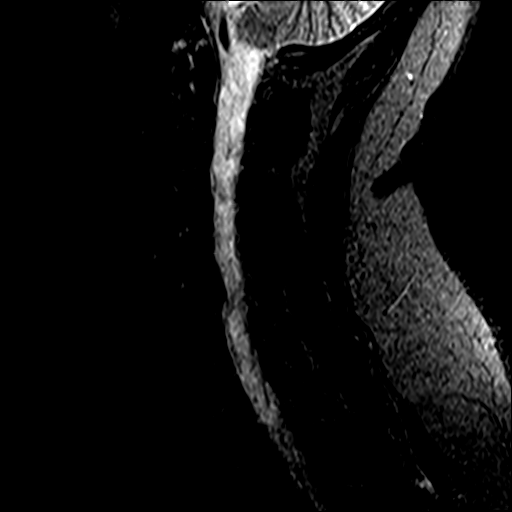
[im 8/15]
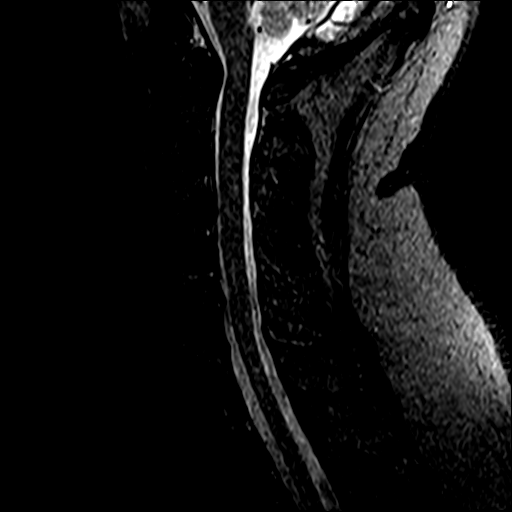
[im 10/15]
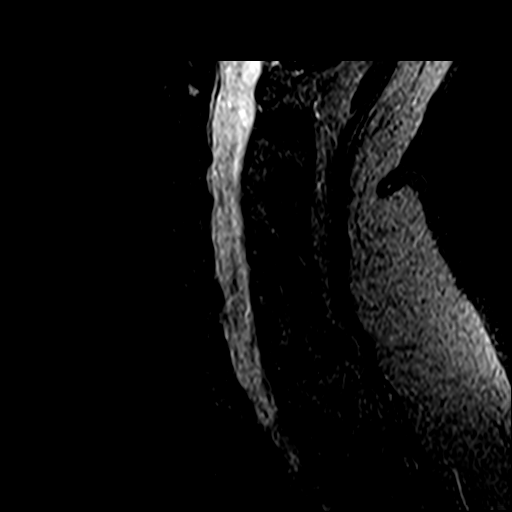
[im 12/15]
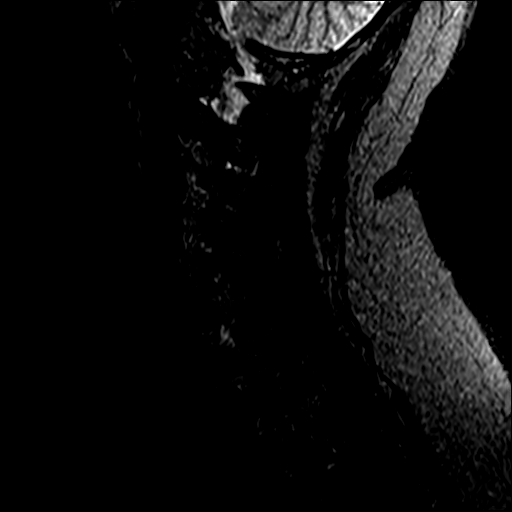
[im 15/15]
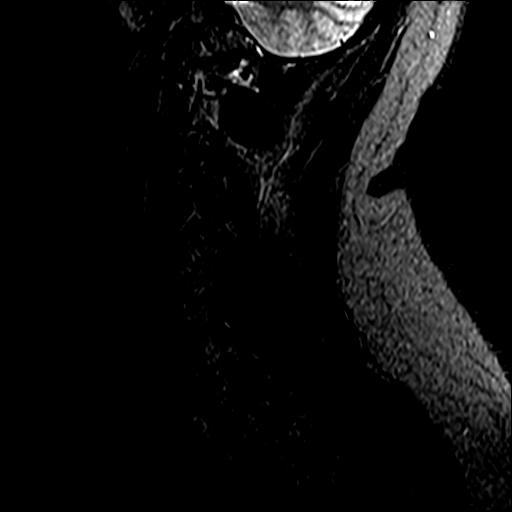

[Series 6: T2 · axial · 3.0mm · 0.70mm/px · z∈[-21,+79]mm · 9 of 27 slices shown (2 of 2)]
[im 1/27]
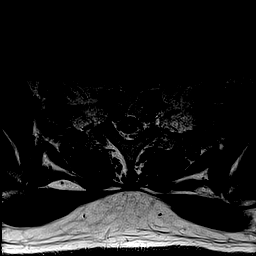
[im 5/27]
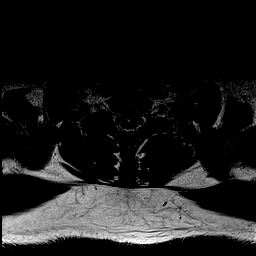
[im 9/27]
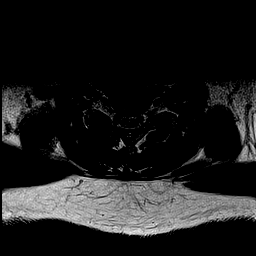
[im 11/27]
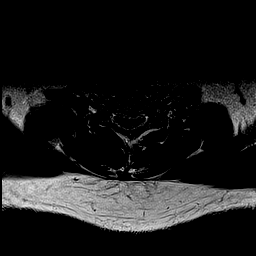
[im 14/27]
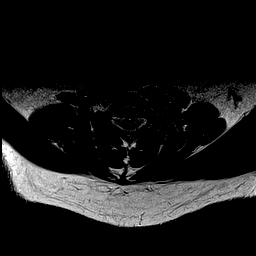
[im 16/27]
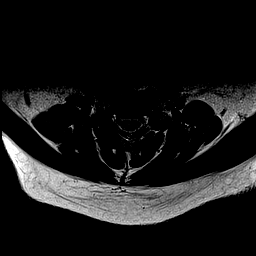
[im 18/27]
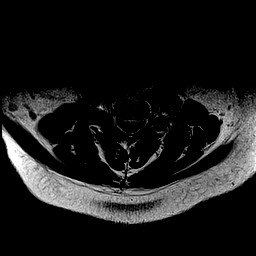
[im 22/27]
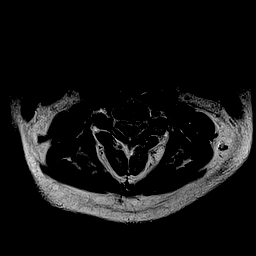
[im 27/27]
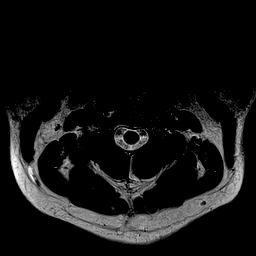

[Series 7: mpgr ax · axial · 3.0mm · 0.35mm/px · z∈[-21,+17]mm · 4 of 27 slices shown]
[im 1/27]
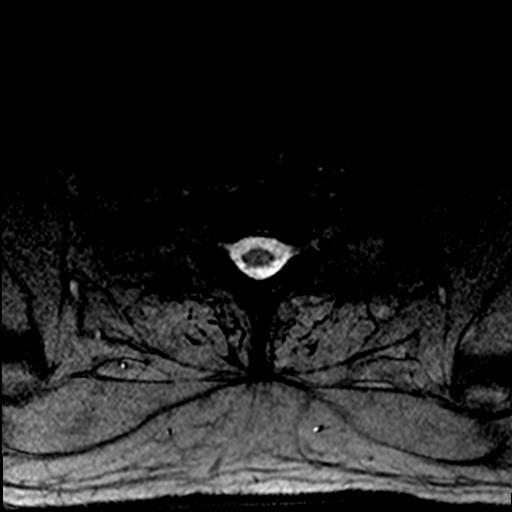
[im 5/27]
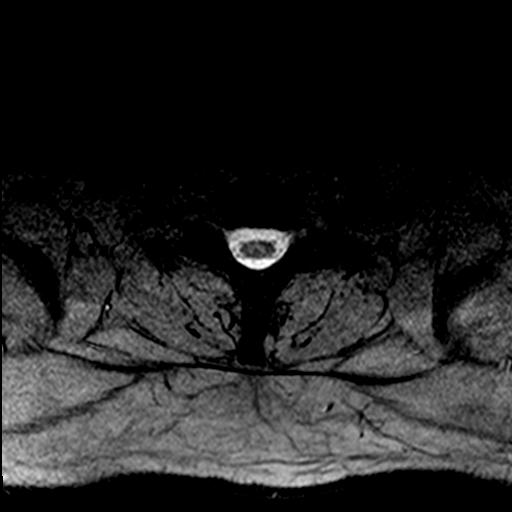
[im 9/27]
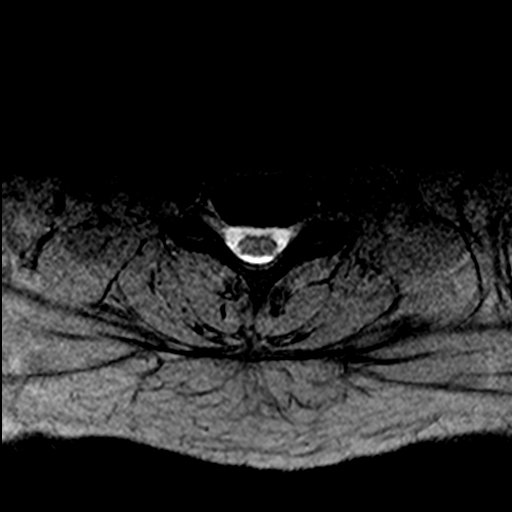
[im 11/27]
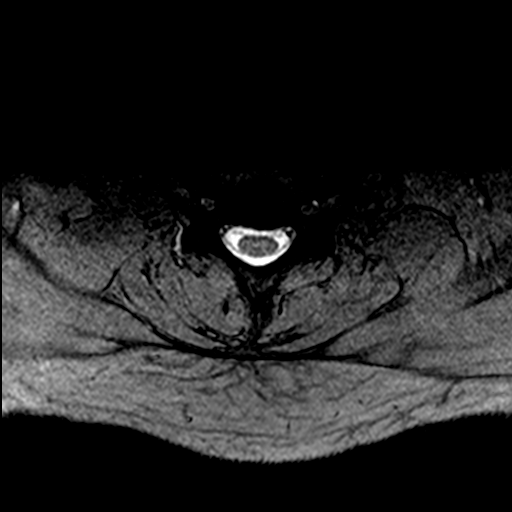

[35 of 48 positions shown; findings below may reference images not displayed]

FINDINGS: Alignment: Maintained.

Vertebrae: Height and signal are normal.

Cord: Normal signal throughout.

Posterior Fossa, vertebral arteries, paraspinal tissues: Negative.

Disc levels:

C2-3:  Mild bulge without central canal or foraminal stenosis.

C3-4: Very shallow left paracentral protrusion without central canal
or foraminal stenosis.

C4-5: Very shallow central protrusion without central canal or
foraminal stenosis.

C5-6: There is a shallow disc osteophyte complex and bilateral
uncovertebral disease. The ventral thecal sac is narrowed but not
effaced. Moderately severe to severe bilateral foraminal narrowing
is worse on the left.

C6-7: Shallow broad-based central protrusion without central canal
or foraminal stenosis.

C7-T1:  Negative.
IMPRESSION: Spondylosis appearing most notable at C5-6 where uncovertebral
disease causes moderately severe to severe foraminal narrowing,
worse on the left. Shallow disc bulge at this level narrows the
ventral thecal sac.

Mild degenerative disc disease C3-4, C4-5 and C6-7 without central
canal or foraminal stenosis as described above.

## 2018-07-17 ENCOUNTER — Encounter: Payer: Managed Care, Other (non HMO) | Admitting: Family Medicine

## 2018-09-29 ENCOUNTER — Telehealth: Payer: Self-pay | Admitting: Family Medicine

## 2018-09-29 NOTE — Telephone Encounter (Signed)
Patient needs an appointment, first available He is on BP medicine and cholesterol medicine and never had his labs done from last year These medicines require monitoring No further prescriptions until appt and labs please Thank you

## 2018-10-02 NOTE — Telephone Encounter (Signed)
lvm @ 8:43am asking pt to schedule a sooner appt in order to obtain any refills

## 2019-01-12 ENCOUNTER — Ambulatory Visit: Payer: Managed Care, Other (non HMO) | Admitting: Family Medicine

## 2019-01-26 ENCOUNTER — Other Ambulatory Visit: Payer: Self-pay

## 2019-01-26 ENCOUNTER — Ambulatory Visit
Admission: EM | Admit: 2019-01-26 | Discharge: 2019-01-26 | Disposition: A | Discharge status: Home / Self Care | Payer: BC Managed Care – PPO | Attending: Family Medicine | Admitting: Family Medicine

## 2019-01-26 ENCOUNTER — Encounter: Payer: Self-pay | Admitting: Emergency Medicine

## 2019-01-26 DIAGNOSIS — R21 Rash and other nonspecific skin eruption: Secondary | ICD-10-CM | POA: Diagnosis not present

## 2019-01-26 MED ORDER — TRIAMCINOLONE ACETONIDE 0.5 % EX OINT: 1.0000 "application " | TOPICAL_OINTMENT | Freq: Two times a day (BID) | CUTANEOUS | 0 refills | Status: AC

## 2019-01-26 NOTE — ED Triage Notes (Signed)
Patient c/o itchy rash that started Tuesday morning. He states the rash is located on the back of his neck and down the left side of his chest and side.

## 2019-01-26 NOTE — ED Provider Notes (Signed)
[facility]    CSN: [id number] Arrival date & time: 01/26/19  1320  History   Chief Complaint Chief Complaint  Patient presents with  . Rash    HPI  48 year old male presents with rash.  Patient reports that he developed an itchy rash that started on Tuesday.  He has several "bumps" on his abdomen as well as the back of his neck.  No medications tried.  He has covered areas with Band-Aids.  His skin seems to be irritated from the Band-Aids as well.  No new contacts or exposures.  No known exacerbating relieving factors.  No other reported symptoms.  No other complaints.  PMH, Surgical Hx, Family Hx, Social History reviewed and updated as below.  Past Medical History:  Diagnosis Date  . GERD (gastroesophageal reflux disease)   . Hyperlipemia   . Hypertension    Patient Active Problem List   Diagnosis Date Noted  . Viral URI with cough 07/09/2017  . Multiple allergies 07/09/2017   Past Surgical History:  Procedure Laterality Date  . CATARACT EXTRACTION    . LASIK     Home Medications    Prior to Admission medications   Medication Sig Start Date End Date Taking? Authorizing Provider  lisinopril (PRINIVIL,ZESTRIL) 20 MG tablet Take 20 mg by mouth daily.   Yes [provider]  lovastatin (MEVACOR) 20 MG tablet Take 20 mg by mouth at bedtime.   Yes [provider]  montelukast (SINGULAIR) 10 MG tablet Take by mouth. 04/15/16  Yes [provider]  omeprazole (PRILOSEC) 20 MG capsule Take by mouth. 08/26/15  Yes [provider]  cimetidine (TAGAMET) 200 MG tablet Take 200 mg by mouth 2 (two) times daily.    [provider]  triamcinolone ointment (KENALOG) 0.5 % Apply 1 application topically 2 (two) times daily. 01/26/19   Coral Spikes, DO  benazepril-hydrochlorthiazide (LOTENSIN HCT) 5-6.25 MG tablet Take by mouth.  01/26/19  [provider]  bisoprolol-hydrochlorothiazide (ZIAC) 2.5-6.25 MG per tablet Take 1  tablet by mouth daily.  01/26/19  [provider]    Family History Family History  Problem Relation Age of Onset  . Cancer Mother   . Heart disease Father     Social History Social History   Tobacco Use  . Smoking status: Former Research scientist (life sciences)  . Smokeless tobacco: Former Network engineer Use Topics  . Alcohol use: Yes    Alcohol/week: 0.0 standard drinks    Comment: occasionally  . Drug use: No     Allergies   Iodinated diagnostic agents   Review of Systems Review of Systems  Constitutional: Negative.   Skin: Positive for rash.   Physical Exam Triage Vital Signs ED Triage Vitals  Enc Vitals Group     BP 01/26/19 1335 (!) 140/104     Pulse Rate 01/26/19 1335 83     Resp 01/26/19 1335 18     Temp 01/26/19 1335 98.6 F (37 C)     Temp Source 01/26/19 1335 Oral     SpO2 01/26/19 1335 97 %     Weight 01/26/19 1332 225 lb (102.1 kg)     Height 01/26/19 1332 5\' 8"  (1.727 m)     Head Circumference --      Peak Flow --      Pain Score 01/26/19 1332 0     Pain Loc --      Pain Edu? --      Excl. in [place]? --  No data found.  Updated Vital Signs BP (!) 140/104 (BP Location: Right Arm)   Pulse 83   Temp 98.6 F (37 C) (Oral)   Resp 18   Ht 5\' 8"  (1.727 m)   Wt 102.1 kg   SpO2 97%   BMI 34.21 kg/m   Visual Acuity Right Eye Distance:   Left Eye Distance:   Bilateral Distance:    Right Eye Near:   Left Eye Near:    Bilateral Near:     Physical Exam Vitals signs and nursing note reviewed.  Constitutional:      General: He is not in acute distress.    Appearance: Normal appearance. He is obese.  HENT:     Head: Normocephalic and atraumatic.  Eyes:     General:        Right eye: No discharge.        Left eye: No discharge.     Conjunctiva/sclera: Conjunctivae normal.  Pulmonary:     Effort: Pulmonary effort is normal. No respiratory distress.  Skin:    Comments: Abdomen and posterior neck with several raised, erythematous papules.   Neurological:     Mental Status: He is alert.  Psychiatric:        Mood and Affect: Mood normal.        Behavior: Behavior normal.    UC Treatments / Results  Labs (all labs ordered are listed, but only abnormal results are displayed) Labs Reviewed - No data to display  EKG None  Radiology No results found.  Procedures Procedures (including critical care time)  Medications Ordered in UC Medications - No data to display  Initial Impression / Assessment and Plan / UC Course  I have reviewed the triage vital signs and the nursing notes.  Pertinent labs & imaging results that were available during my care of the patient were reviewed by me and considered in my medical decision making (see chart for details).    48 year old male presents with rash. Suspect contact/irritant. Treating with Triamcinolone ointment.  Final Clinical Impressions(s) / UC Diagnoses   Final diagnoses:  Rash   Discharge Instructions   None    ED Prescriptions    Medication Sig Dispense Auth. Provider   triamcinolone ointment (KENALOG) 0.5 % Apply 1 application topically 2 (two) times daily. 30 g Tommie Samsook, Maryn Freelove G, DO     Controlled Substance Prescriptions [place] Controlled Substance Registry consulted? Not Applicable   Tommie SamsCook, Zaida Reiland G, DO 01/26/19 1455

## 2019-11-15 ENCOUNTER — Encounter: Payer: Self-pay | Admitting: Emergency Medicine

## 2019-11-15 ENCOUNTER — Other Ambulatory Visit: Payer: Self-pay

## 2019-11-15 ENCOUNTER — Inpatient Hospital Stay
Admission: EM | Admit: 2019-11-15 | Discharge: 2019-11-17 | DRG: 603 | Disposition: A | Discharge status: Home / Self Care | Payer: Managed Care, Other (non HMO) | Attending: Internal Medicine | Admitting: Internal Medicine

## 2019-11-15 DIAGNOSIS — L02415 Cutaneous abscess of right lower limb: Secondary | ICD-10-CM

## 2019-11-15 DIAGNOSIS — Z885 Allergy status to narcotic agent status: Secondary | ICD-10-CM

## 2019-11-15 DIAGNOSIS — Z83438 Family history of other disorder of lipoprotein metabolism and other lipidemia: Secondary | ICD-10-CM | POA: Diagnosis not present

## 2019-11-15 DIAGNOSIS — L03115 Cellulitis of right lower limb: Secondary | ICD-10-CM | POA: Diagnosis present

## 2019-11-15 DIAGNOSIS — I10 Essential (primary) hypertension: Secondary | ICD-10-CM | POA: Diagnosis present

## 2019-11-15 DIAGNOSIS — Z87891 Personal history of nicotine dependence: Secondary | ICD-10-CM

## 2019-11-15 DIAGNOSIS — E785 Hyperlipidemia, unspecified: Secondary | ICD-10-CM | POA: Diagnosis present

## 2019-11-15 DIAGNOSIS — E871 Hypo-osmolality and hyponatremia: Secondary | ICD-10-CM | POA: Diagnosis present

## 2019-11-15 DIAGNOSIS — Z8249 Family history of ischemic heart disease and other diseases of the circulatory system: Secondary | ICD-10-CM

## 2019-11-15 DIAGNOSIS — Z808 Family history of malignant neoplasm of other organs or systems: Secondary | ICD-10-CM

## 2019-11-15 DIAGNOSIS — R739 Hyperglycemia, unspecified: Secondary | ICD-10-CM | POA: Diagnosis present

## 2019-11-15 DIAGNOSIS — Z79899 Other long term (current) drug therapy: Secondary | ICD-10-CM | POA: Diagnosis not present

## 2019-11-15 DIAGNOSIS — Z807 Family history of other malignant neoplasms of lymphoid, hematopoietic and related tissues: Secondary | ICD-10-CM

## 2019-11-15 DIAGNOSIS — E876 Hypokalemia: Secondary | ICD-10-CM | POA: Diagnosis present

## 2019-11-15 LAB — CBC WITH DIFFERENTIAL/PLATELET
Abs Immature Granulocytes: 0.07 10*3/uL (ref 0.00–0.07)
Basophils Absolute: 0.1 10*3/uL (ref 0.0–0.1)
Basophils Relative: 0 %
Eosinophils Absolute: 0 10*3/uL (ref 0.0–0.5)
Eosinophils Relative: 0 %
HCT: 41.4 % (ref 39.0–52.0)
Hemoglobin: 14.6 g/dL (ref 13.0–17.0)
Immature Granulocytes: 1 %
Lymphocytes Relative: 7 %
Lymphs Abs: 0.9 10*3/uL (ref 0.7–4.0)
MCH: 30.9 pg (ref 26.0–34.0)
MCHC: 35.3 g/dL (ref 30.0–36.0)
MCV: 87.5 fL (ref 80.0–100.0)
Monocytes Absolute: 0.8 10*3/uL (ref 0.1–1.0)
Monocytes Relative: 6 %
Neutro Abs: 12.4 10*3/uL — ABNORMAL HIGH (ref 1.7–7.7)
Neutrophils Relative %: 86 %
Platelets: 452 10*3/uL — ABNORMAL HIGH (ref 150–400)
RBC: 4.73 MIL/uL (ref 4.22–5.81)
RDW: 12.9 % (ref 11.5–15.5)
WBC: 14.3 10*3/uL — ABNORMAL HIGH (ref 4.0–10.5)
nRBC: 0 % (ref 0.0–0.2)

## 2019-11-15 LAB — COMPREHENSIVE METABOLIC PANEL
ALT: 36 U/L (ref 0–44)
AST: 26 U/L (ref 15–41)
Albumin: 4.1 g/dL (ref 3.5–5.0)
Alkaline Phosphatase: 100 U/L (ref 38–126)
Anion gap: 13 (ref 5–15)
BUN: 17 mg/dL (ref 6–20)
CO2: 27 mmol/L (ref 22–32)
Calcium: 9.3 mg/dL (ref 8.9–10.3)
Chloride: 93 mmol/L — ABNORMAL LOW (ref 98–111)
Creatinine, Ser: 1.17 mg/dL (ref 0.61–1.24)
GFR calc Af Amer: >60 mL/min (ref >60)
GFR calc non Af Amer: >60 mL/min (ref >60)
Glucose, Bld: 193 mg/dL — ABNORMAL HIGH (ref 70–99)
Potassium: 2.4 mmol/L — CL (ref 3.5–5.1)
Sodium: 133 mmol/L — ABNORMAL LOW (ref 135–145)
Total Bilirubin: 1.1 mg/dL (ref 0.3–1.2)
Total Protein: 8 g/dL (ref 6.5–8.1)

## 2019-11-15 LAB — HEMOGLOBIN A1C
Hgb A1c MFr Bld: 5.6 % (ref 4.8–5.6)
Mean Plasma Glucose: 114.02 mg/dL

## 2019-11-15 LAB — LACTIC ACID, PLASMA
Lactic Acid, Venous: 1.4 mmol/L (ref 0.5–1.9)
Lactic Acid, Venous: 1.9 mmol/L (ref 0.5–1.9)

## 2019-11-15 LAB — TSH: TSH: 2.31 u[IU]/mL (ref 0.350–4.500)

## 2019-11-15 MED ORDER — HYDROCHLOROTHIAZIDE 25 MG PO TABS
25.0000 mg | ORAL_TABLET | Freq: Every day | ORAL | Status: DC
  Administered 2019-11-16 – 2019-11-17 (×2): 25 mg via ORAL
  Filled 2019-11-15 (×2): qty 1

## 2019-11-15 MED ORDER — LOSARTAN POTASSIUM 50 MG PO TABS
50.0000 mg | ORAL_TABLET | Freq: Every day | ORAL | Status: DC
  Administered 2019-11-16 – 2019-11-17 (×2): 50 mg via ORAL
  Filled 2019-11-15 (×2): qty 1

## 2019-11-15 MED ORDER — DOCUSATE SODIUM 100 MG PO CAPS
100.0000 mg | ORAL_CAPSULE | Freq: Two times a day (BID) | ORAL | Status: DC
  Administered 2019-11-15 – 2019-11-17 (×3): 100 mg via ORAL
  Filled 2019-11-15 (×3): qty 1

## 2019-11-15 MED ORDER — ONDANSETRON HCL 4 MG PO TABS: 4.0000 mg | ORAL_TABLET | Freq: Four times a day (QID) | ORAL | Status: DC | PRN

## 2019-11-15 MED ORDER — KETOROLAC TROMETHAMINE 30 MG/ML IJ SOLN
30.0000 mg | Freq: Four times a day (QID) | INTRAMUSCULAR | Status: DC | PRN
  Administered 2019-11-15 – 2019-11-17 (×4): 30 mg via INTRAVENOUS
  Filled 2019-11-15 (×4): qty 1

## 2019-11-15 MED ORDER — VANCOMYCIN HCL IN DEXTROSE 1-5 GM/200ML-% IV SOLN: 1000.0000 mg | Freq: Once | INTRAVENOUS | Status: DC

## 2019-11-15 MED ORDER — MONTELUKAST SODIUM 10 MG PO TABS
10.0000 mg | ORAL_TABLET | Freq: Every day | ORAL | Status: DC
  Administered 2019-11-15 – 2019-11-16 (×2): 10 mg via ORAL
  Filled 2019-11-15 (×2): qty 1

## 2019-11-15 MED ORDER — ONDANSETRON HCL 4 MG/2ML IJ SOLN: 4.0000 mg | Freq: Four times a day (QID) | INTRAMUSCULAR | Status: DC | PRN

## 2019-11-15 MED ORDER — ASPIRIN EC 81 MG PO TBEC
81.0000 mg | DELAYED_RELEASE_TABLET | Freq: Every day | ORAL | Status: DC
  Administered 2019-11-16 – 2019-11-17 (×2): 81 mg via ORAL
  Filled 2019-11-15 (×2): qty 1

## 2019-11-15 MED ORDER — AMLODIPINE BESYLATE 5 MG PO TABS
5.0000 mg | ORAL_TABLET | Freq: Every day | ORAL | Status: DC
  Administered 2019-11-16 – 2019-11-17 (×2): 5 mg via ORAL
  Filled 2019-11-15 (×2): qty 1

## 2019-11-15 MED ORDER — ATORVASTATIN CALCIUM 20 MG PO TABS
40.0000 mg | ORAL_TABLET | Freq: Every day | ORAL | Status: DC
  Administered 2019-11-15 – 2019-11-16 (×2): 40 mg via ORAL
  Filled 2019-11-15 (×2): qty 2

## 2019-11-15 MED ORDER — ACETAMINOPHEN 325 MG PO TABS: 650.0000 mg | ORAL_TABLET | Freq: Four times a day (QID) | ORAL | Status: DC | PRN

## 2019-11-15 MED ORDER — ENOXAPARIN SODIUM 40 MG/0.4ML ~~LOC~~ SOLN
40.0000 mg | SUBCUTANEOUS | Status: DC
  Administered 2019-11-15: 40 mg via SUBCUTANEOUS
  Filled 2019-11-15: qty 0.4

## 2019-11-15 MED ORDER — POTASSIUM CHLORIDE CRYS ER 20 MEQ PO TBCR
40.0000 meq | EXTENDED_RELEASE_TABLET | Freq: Once | ORAL | Status: AC
  Administered 2019-11-15: 40 meq via ORAL
  Filled 2019-11-15: qty 2

## 2019-11-15 MED ORDER — VANCOMYCIN HCL 1750 MG/350ML IV SOLN
1750.0000 mg | Freq: Once | INTRAVENOUS | Status: AC
  Administered 2019-11-15: 1750 mg via INTRAVENOUS
  Filled 2019-11-15: qty 350

## 2019-11-15 MED ORDER — VANCOMYCIN HCL IN DEXTROSE 1-5 GM/200ML-% IV SOLN
1000.0000 mg | Freq: Two times a day (BID) | INTRAVENOUS | Status: DC
  Administered 2019-11-16: 1000 mg via INTRAVENOUS
  Filled 2019-11-15 (×3): qty 200

## 2019-11-15 MED ORDER — ACETAMINOPHEN 650 MG RE SUPP: 650.0000 mg | Freq: Four times a day (QID) | RECTAL | Status: DC | PRN

## 2019-11-15 MED ORDER — OXYCODONE-ACETAMINOPHEN 5-325 MG PO TABS
2.0000 | ORAL_TABLET | Freq: Once | ORAL | Status: AC
  Administered 2019-11-15: 2 via ORAL
  Filled 2019-11-15: qty 2

## 2019-11-15 MED ORDER — POTASSIUM CHLORIDE IN NACL 20-0.9 MEQ/L-% IV SOLN
Freq: Once | INTRAVENOUS | Status: AC
  Filled 2019-11-15: qty 1000

## 2019-11-15 NOTE — H&P (Signed)
[facility] [place] NAME: Andrew Bond    MR#:  [id number]  DATE OF BIRTH:  [birth year]  DATE OF ADMISSION:  11/15/2019  PRIMARY CARE PHYSICIAN: Remi Haggard, FNP   REQUESTING/REFERRING PHYSICIAN: Earleen Newport, MD  From home  CHIEF COMPLAINT:   Chief Complaint  Patient presents with  . Cellulitis    HISTORY OF PRESENT ILLNESS:  Andrew Bond  is a 49 y.o. male with a known history of hyperlipidemia, hyperglycemia, hypertension  is being admitted for cellulitis in his right leg. Patient started with pustular lesion on his right knee last Friday which he expressed open with lot of pus coming out. He started seeing redness below his right knee which continued to get worse over the weekend. Patient states he was diagnosed yesterday, has had 2 doses of oral Levaquin and received a shot of Rocephin at his primary care doctor's office today.  They report the area has gotten worse today, earlier he had fever and chills.  Discomfort is 3 out of 10 in the right leg which is an ache. His redness continued to get worse and he decided to come to the emergency department. PAST MEDICAL HISTORY:   Past Medical History:  Diagnosis Date  . Allergic rhinitis due to pollen 04/01/2015  . Dyslipidemia 04/22/2015  . Essential hypertension, benign 04/01/2015  . Hyperglycemia 04/16/2016   PAST SURGICAL HISTORY:   Past Surgical History:  Procedure Laterality Date  . HERNIA REPAIR     x6  . NM RENAL LASIX ([place] HX)    . REFRACTIVE SURGERY    . TOE SURGERY    . WRIST SURGERY Bilateral    carpal tunnel   SOCIAL HISTORY:   Social History   Tobacco Use  . Smoking status: Former Smoker    Packs/day: 0.50    Years: 10.00    Pack years: 5.00    Types: Cigarettes    Quit date: 08/02/2014    Years since quitting: 5.2  . Smokeless tobacco: Never Used  Substance Use Topics  . Alcohol use: No   FAMILY HISTORY:   Family History  Problem Relation Age of  Onset  . Hypertension Mother   . Cancer Mother        cervical  . Heart disease Father   . Heart attack Father   . Hypertension Brother   . Hyperlipidemia Brother   . Cancer Maternal Grandmother        lymph nodes, spread to lungs   DRUG ALLERGIES:   Allergies  Allergen Reactions  . Codeine Nausea And Vomiting   REVIEW OF SYSTEMS:  Review of Systems  Constitutional: Positive for fever. Negative for diaphoresis, malaise/fatigue and weight loss.  HENT: Negative for ear discharge, ear pain, hearing loss, nosebleeds, sore throat and tinnitus.   Eyes: Negative for blurred vision and pain.  Respiratory: Negative for cough, hemoptysis, shortness of breath and wheezing.   Cardiovascular: Negative for chest pain, palpitations, orthopnea and leg swelling.  Gastrointestinal: Negative for abdominal pain, blood in stool, constipation, diarrhea, heartburn, nausea and vomiting.  Genitourinary: Negative for dysuria, frequency and urgency.  Musculoskeletal: Negative for back pain and myalgias.  Skin: Negative for itching and rash.       Erythema  Neurological: Negative for dizziness, tingling, tremors, focal weakness, seizures, weakness and headaches.  Psychiatric/Behavioral: Negative for depression. The patient is not nervous/anxious.    MEDICATIONS AT HOME:   Prior to Admission medications   Medication Sig Start Date  End Date Taking? Authorizing Provider  atorvastatin (LIPITOR) 40 MG tablet Take 1 tablet (40 mg total) by mouth at bedtime. 01/10/18  Yes Lada, Satira Anis, MD  chlorthalidone (HYGROTON) 50 MG tablet Take 50 mg by mouth daily.  10/20/19  Yes [provider]  ciprofloxacin (CIPRO) 500 MG tablet Take 500 mg by mouth 2 (two) times daily. 11/14/19  Yes [provider]  losartan (COZAAR) 50 MG tablet Take 50 mg by mouth daily. 10/11/19  Yes [provider]  amLODipine (NORVASC) 5 MG tablet Take 1 tablet (5 mg total) by mouth daily. Patient not taking: Reported  on 11/15/2019 01/10/18   Arnetha Courser, MD  fluticasone (FLONASE) 50 MCG/ACT nasal spray Place 2 sprays into both nostrils daily as needed. 01/12/18   Arnetha Courser, MD  hydrochlorothiazide (HYDRODIURIL) 25 MG tablet Take 1 tablet (25 mg total) by mouth daily. Patient not taking: Reported on 11/15/2019 01/10/18   Arnetha Courser, MD  loratadine (CLARITIN) 10 MG tablet Take 10 mg by mouth daily as needed.     [provider]  ranitidine (ZANTAC) 300 MG tablet Take 300 mg by mouth daily as needed.  08/26/15 08/25/16  [provider]    VITAL SIGNS:  Blood pressure (!) 147/81, pulse (!) 105, temperature 98.6 F (37 C), temperature source Oral, resp. rate 18, height 5\' 8"  (1.727 m), weight 81.6 kg, SpO2 97 %. PHYSICAL EXAMINATION:  Physical Exam  GENERAL:  49 y.o.-year-old patient lying in the bed with no acute distress.  EYES: Pupils equal, round, reactive to light and accommodation. No scleral icterus. Extraocular muscles intact.  HEENT: Head atraumatic, normocephalic. Oropharynx and nasopharynx clear.  NECK:  Supple, no jugular venous distention. No thyroid enlargement, no tenderness.  LUNGS: Normal breath sounds bilaterally, no wheezing, rales,rhonchi or crepitation. No use of accessory muscles of respiration.  CARDIOVASCULAR: S1, S2 normal. No murmurs, rubs, or gallops.  ABDOMEN: Soft, nontender, nondistended. Bowel sounds present. No organomegaly or mass.  EXTREMITIES: No pedal edema, cyanosis, or clubbing.  NEUROLOGIC: Cranial nerves II through XII are intact. Muscle strength 5/5 in all extremities. Sensation intact. Gait not checked.  PSYCHIATRIC: The patient is alert and oriented x 3.  SKIN: Extensive erythema of the right lower extremity extending from ankle to right knee and some posteriorly.  Dry scab over his right lower knee area where he expressed pus last Friday LABORATORY PANEL:   CBC Recent Labs  Lab 11/15/19 1213  WBC 14.3*  HGB 14.6  HCT 41.4  PLT 452*    ------------------------------------------------------------------------------------------------------------------  Chemistries  Recent Labs  Lab 11/15/19 1213  NA 133*  K 2.4*  CL 93*  CO2 27  GLUCOSE 193*  BUN 17  CREATININE 1.17  CALCIUM 9.3  AST 26  ALT 36  ALKPHOS 100  BILITOT 1.1   ------------------------------------------------------------------------------------------------------------------  Cardiac Enzymes No results for input(s): TROPONINI in the last 168 hours. ------------------------------------------------------------------------------------------------------------------  RADIOLOGY:  No results found.  IMPRESSION AND PLAN:  49 year old male known history of hyperlipidemia, hyperglycemia, hypertension  is being admitted for cellulitis in his right leg  *Right lower extremity cellulitis -We will start cellulitis order set - IV vancomycin, site has been marked we will watch for erythema change  Hypokalemia Replete and recheck  Hypertension Continue home medications  Hyponatremia Start fluids  All the records are reviewed and case discussed with ED provider. Management plans discussed with the patient, family (wife at bedside) and they are in agreement.  CODE STATUS: Full code  TOTAL TIME TAKING CARE OF THIS PATIENT: 45 minutes.    Max Sane M.D on 11/15/2019 at 4:11 PM  [facility]   CC: Primary care physician; Remi Haggard, FNP   Note: This dictation was prepared with Dragon dictation along with smaller phrase technology. Any transcriptional errors that result from this process are unintentional.

## 2019-11-15 NOTE — Consult Note (Signed)
Pharmacy Antibiotic Note  Andrew Bond is a 49 y.o. male admitted on 11/15/2019 with cellulitis.  Pharmacy has been consulted for Vancomycin dosing.  Plan: Vancomycin 1000 mg IV Q 12 hrs. Goal AUC 400-550. Expected AUC: 512.7 Expected Css: 14.9 SCr used: 1.17   Height: 5\' 8"  (172.7 cm) Weight: 81.6 kg (180 lb) IBW/kg (Calculated) : 68.4  Temp (24hrs), Avg:98.5 F (36.9 C), Min:98.4 F (36.9 C), Max:98.6 F (37 C)  Recent Labs  Lab 11/15/19 1213 11/15/19 1530  WBC 14.3*  --   CREATININE 1.17  --   LATICACIDVEN 1.4 1.9    Estimated Creatinine Clearance: 74.7 mL/min (by C-G formula based on SCr of 1.17 mg/dL).    Allergies  Allergen Reactions  . Codeine Nausea And Vomiting    Antimicrobials this admission: Vancomycin 4/15 >>    Thank you for allowing pharmacy to be a part of this patient's care.  Pearla Dubonnet 11/15/2019 6:24 PM

## 2019-11-15 NOTE — ED Provider Notes (Signed)
[facility] Emergency Department Provider Note       Time seen: ----------------------------------------- 3:10 PM on 11/15/2019 -----------------------------------------   I have reviewed the triage vital signs and the nursing notes.  HISTORY   Chief Complaint Cellulitis    HPI Andrew Bond is a 49 y.o. male with a history of hyperlipidemia, hyperglycemia, hypertension who presents to the ED for cellulitis in his right leg.  Patient states he was diagnosed yesterday, has had 2 doses of oral Levaquin and received a shot of Rocephin at his primary care doctor's office today.  They report the area has gotten worse today, earlier he had fever and chills.  Discomfort is 3 out of 10 in the right leg which is an ache.  Past Medical History:  Diagnosis Date  . Allergic rhinitis due to pollen 04/01/2015  . Dyslipidemia 04/22/2015  . Essential hypertension, benign 04/01/2015  . Hyperglycemia 04/16/2016    Patient Active Problem List   Diagnosis Date Noted  . Overweight (BMI 25.0-29.9) 01/10/2018  . Neck pain, chronic 10/14/2016  . Hyperglycemia 04/16/2016  . Chronic cough 09/24/2015  . Medication monitoring encounter 04/22/2015  . Dyslipidemia 04/22/2015  . Essential hypertension, benign 04/01/2015  . Allergic rhinitis due to pollen 04/01/2015  . Preventative health care 04/01/2015    Past Surgical History:  Procedure Laterality Date  . HERNIA REPAIR     x6  . NM RENAL LASIX ([place] HX)    . REFRACTIVE SURGERY    . TOE SURGERY    . WRIST SURGERY Bilateral    carpal tunnel    Allergies Codeine  Social History Social History   Tobacco Use  . Smoking status: Former Smoker    Packs/day: 0.50    Years: 10.00    Pack years: 5.00    Types: Cigarettes    Quit date: 08/02/2014    Years since quitting: 5.2  . Smokeless tobacco: Never Used  Substance Use Topics  . Alcohol use: No  . Drug use: No    Review of Systems Constitutional:  Positive for recent fever and chills Cardiovascular: Negative for chest pain. Respiratory: Negative for shortness of breath. Gastrointestinal: Negative for abdominal pain, vomiting and diarrhea. Musculoskeletal: Positive for right leg pain and swelling Skin: Positive for right leg erythema Neurological: Negative for headaches, focal weakness or numbness.  All systems negative/normal/unremarkable except as stated in the HPI  ____________________________________________   PHYSICAL EXAM:  VITAL SIGNS: ED Triage Vitals  Enc Vitals Group     BP 11/15/19 1208 (!) 147/81     Pulse Rate 11/15/19 1208 (!) 105     Resp 11/15/19 1208 18     Temp 11/15/19 1208 98.6 F (37 C)     Temp Source 11/15/19 1208 Oral     SpO2 11/15/19 1208 97 %     Weight 11/15/19 1209 180 lb (81.6 kg)     Height 11/15/19 1209 5\' 8"  (1.727 m)     Head Circumference --      Peak Flow --      Pain Score 11/15/19 1209 3     Pain Loc --      Pain Edu? --      Excl. in [place]? --     Constitutional: Alert and oriented. Well appearing and in no distress. Eyes: Conjunctivae are normal. Normal extraocular movements. Cardiovascular: Normal rate, regular rhythm. No murmurs, rubs, or gallops. Respiratory: Normal respiratory effort without tachypnea nor retractions. Breath sounds are clear and equal bilaterally.  No wheezes/rales/rhonchi. Gastrointestinal: Soft and nontender. Normal bowel sounds Musculoskeletal: Tenderness around the right lower extremity from ankle to knee with extensive erythema and obvious cellulitis.  There is a small healing pustule near the inferior aspect of the patella on the right Neurologic:  Normal speech and language. No gross focal neurologic deficits are appreciated.  Skin: Extensive erythema of the right lower extremity extending from ankle to right knee and some posteriorly Psychiatric: Mood and affect are normal. Speech and behavior are normal.   ____________________________________________  ED COURSE:  As part of my medical decision making, I reviewed the following data within the [place] History obtained from family if available, nursing notes, old chart and ekg, as well as notes from prior ED visits. Patient presented for cellulitis, we will assess with labs as indicated at this time.   Procedures  Andrew Bond was evaluated in Emergency Department on 11/15/2019 for the symptoms described in the history of present illness. He was evaluated in the context of the global COVID-19 pandemic, which necessitated consideration that the patient might be at risk for infection with the SARS-CoV-2 virus that causes COVID-19. Institutional protocols and algorithms that pertain to the evaluation of patients at risk for COVID-19 are in a state of rapid change based on information released by regulatory bodies including the CDC and federal and state organizations. These policies and algorithms were followed during the patient's care in the ED.  ____________________________________________   LABS (pertinent positives/negatives)  Labs Reviewed  COMPREHENSIVE METABOLIC PANEL - Abnormal; Notable for the following components:      Result Value   Sodium 133 (*)    Potassium 2.4 (*)    Chloride 93 (*)    Glucose, Bld 193 (*)    All other components within normal limits  CBC WITH DIFFERENTIAL/PLATELET - Abnormal; Notable for the following components:   WBC 14.3 (*)    Platelets 452 (*)    Neutro Abs 12.4 (*)    All other components within normal limits  CULTURE, BLOOD (ROUTINE X 2)  CULTURE, BLOOD (ROUTINE X 2)  LACTIC ACID, PLASMA  LACTIC ACID, PLASMA  ____________________________________________   DIFFERENTIAL DIAGNOSIS   Cellulitis, abscess, sepsis, Sirs, DVT unlikely  FINAL ASSESSMENT AND PLAN  Cellulitis, hypokalemia   Plan: The patient had presented for cellulitis which has worsened despite being  on adequate oral antibiotics. Patient's labs did indicate mild leukocytosis and also hypokalemia which will be repleted orally and via IV.  He has received IV vancomycin for right leg cellulitis.  I will discuss with the hospitalist for admission.   Laurence Aly, MD    Note: This note was generated in part or whole with voice recognition software. Voice recognition is usually quite accurate but there are transcription errors that can and very often do occur. I apologize for any typographical errors that were not detected and corrected.     Earleen Newport, MD 11/15/19 [phone]

## 2019-11-15 NOTE — ED Triage Notes (Signed)
Patient to ER for c/o cellulitis to right leg. Patient was diagnosed yesterday, area has gotten worse today. Was sent home with oral Levaquin.

## 2019-11-16 ENCOUNTER — Other Ambulatory Visit: Payer: Self-pay

## 2019-11-16 DIAGNOSIS — E876 Hypokalemia: Secondary | ICD-10-CM | POA: Diagnosis not present

## 2019-11-16 DIAGNOSIS — L03115 Cellulitis of right lower limb: Secondary | ICD-10-CM | POA: Diagnosis not present

## 2019-11-16 DIAGNOSIS — L02415 Cutaneous abscess of right lower limb: Secondary | ICD-10-CM | POA: Diagnosis not present

## 2019-11-16 LAB — BASIC METABOLIC PANEL
Anion gap: 11 (ref 5–15)
BUN: 22 mg/dL — ABNORMAL HIGH (ref 6–20)
CO2: 25 mmol/L (ref 22–32)
Calcium: 8.6 mg/dL — ABNORMAL LOW (ref 8.9–10.3)
Chloride: 100 mmol/L (ref 98–111)
Creatinine, Ser: 0.96 mg/dL (ref 0.61–1.24)
GFR calc Af Amer: >60 mL/min (ref >60)
GFR calc non Af Amer: >60 mL/min (ref >60)
Glucose, Bld: 129 mg/dL — ABNORMAL HIGH (ref 70–99)
Potassium: 2.8 mmol/L — ABNORMAL LOW (ref 3.5–5.1)
Sodium: 136 mmol/L (ref 135–145)

## 2019-11-16 LAB — CBC
HCT: 37.2 % — ABNORMAL LOW (ref 39.0–52.0)
Hemoglobin: 12.7 g/dL — ABNORMAL LOW (ref 13.0–17.0)
MCH: 31.2 pg (ref 26.0–34.0)
MCHC: 34.1 g/dL (ref 30.0–36.0)
MCV: 91.4 fL (ref 80.0–100.0)
Platelets: 426 10*3/uL — ABNORMAL HIGH (ref 150–400)
RBC: 4.07 MIL/uL — ABNORMAL LOW (ref 4.22–5.81)
RDW: 13.5 % (ref 11.5–15.5)
WBC: 10.4 10*3/uL (ref 4.0–10.5)
nRBC: 0 % (ref 0.0–0.2)

## 2019-11-16 LAB — MAGNESIUM: Magnesium: 2.3 mg/dL (ref 1.7–2.4)

## 2019-11-16 LAB — HIV ANTIBODY (ROUTINE TESTING W REFLEX): HIV Screen 4th Generation wRfx: NONREACTIVE

## 2019-11-16 MED ORDER — POTASSIUM CHLORIDE CRYS ER 20 MEQ PO TBCR
40.0000 meq | EXTENDED_RELEASE_TABLET | Freq: Three times a day (TID) | ORAL | Status: AC
  Administered 2019-11-16 (×2): 40 meq via ORAL
  Filled 2019-11-16 (×2): qty 2

## 2019-11-16 MED ORDER — POTASSIUM CHLORIDE 10 MEQ/100ML IV SOLN
10.0000 meq | INTRAVENOUS | Status: DC
  Administered 2019-11-16: 10 meq via INTRAVENOUS
  Filled 2019-11-16: qty 100

## 2019-11-16 MED ORDER — POTASSIUM CHLORIDE CRYS ER 20 MEQ PO TBCR
40.0000 meq | EXTENDED_RELEASE_TABLET | Freq: Once | ORAL | Status: AC
  Administered 2019-11-16: 40 meq via ORAL
  Filled 2019-11-16: qty 2

## 2019-11-16 MED ORDER — VANCOMYCIN HCL 1250 MG/250ML IV SOLN
1250.0000 mg | Freq: Two times a day (BID) | INTRAVENOUS | Status: DC
  Administered 2019-11-16 – 2019-11-17 (×2): 1250 mg via INTRAVENOUS
  Filled 2019-11-16 (×3): qty 250

## 2019-11-16 NOTE — Progress Notes (Signed)
[facility] [place] NAME: Andrew Bond    MR#:  [id number]  DATE OF BIRTH:  May 09, 1971  SUBJECTIVE:  CHIEF COMPLAINT:   Chief Complaint  Patient presents with  . Cellulitis  improving, requesting to leave, K 2.8, wife at bedside REVIEW OF SYSTEMS:  Review of Systems  Constitutional: Negative for diaphoresis, fever, malaise/fatigue and weight loss.  HENT: Negative for ear discharge, ear pain, hearing loss, nosebleeds, sore throat and tinnitus.   Eyes: Negative for blurred vision and pain.  Respiratory: Negative for cough, hemoptysis, shortness of breath and wheezing.   Cardiovascular: Negative for chest pain, palpitations, orthopnea and leg swelling.  Gastrointestinal: Negative for abdominal pain, blood in stool, constipation, diarrhea, heartburn, nausea and vomiting.  Genitourinary: Negative for dysuria, frequency and urgency.  Musculoskeletal: Negative for back pain and myalgias.  Skin: Positive for rash. Negative for itching.  Neurological: Negative for dizziness, tingling, tremors, focal weakness, seizures, weakness and headaches.  Psychiatric/Behavioral: Negative for depression. The patient is not nervous/anxious.    DRUG ALLERGIES:   Allergies  Allergen Reactions  . Codeine Nausea And Vomiting   VITALS:  Blood pressure 120/65, pulse 83, temperature 98.2 F (36.8 C), temperature source Oral, resp. rate 18, height 5\' 8"  (1.727 m), weight 81.6 kg, SpO2 96 %. PHYSICAL EXAMINATION:  Physical Exam HENT:     Head: Normocephalic and atraumatic.  Eyes:     Conjunctiva/sclera: Conjunctivae normal.     Pupils: Pupils are equal, round, and reactive to light.  Neck:     Thyroid: No thyromegaly.     Trachea: No tracheal deviation.  Cardiovascular:     Rate and Rhythm: Normal rate and regular rhythm.     Heart sounds: Normal heart sounds.  Pulmonary:     Effort: Pulmonary effort is normal. No respiratory distress.     Breath sounds: Normal breath  sounds. No wheezing.  Chest:     Chest wall: No tenderness.  Abdominal:     General: Bowel sounds are normal. There is no distension.     Palpations: Abdomen is soft.     Tenderness: There is no abdominal tenderness.  Musculoskeletal:        General: Normal range of motion.     Cervical back: Normal range of motion and neck supple.  Skin:    General: Skin is warm and dry.     Findings: Erythema present.     Comments: erythema improving on right lower extremity   Neurological:     Mental Status: He is alert and oriented to person, place, and time.     Cranial Nerves: No cranial nerve deficit.    LABORATORY PANEL:  Male CBC Recent Labs  Lab 11/16/19 1229  WBC 10.4  HGB 12.7*  HCT 37.2*  PLT 426*   ------------------------------------------------------------------------------------------------------------------ Chemistries  Recent Labs  Lab 11/15/19 1213 11/15/19 1213 11/16/19 1229  NA 133*   < > 136  K 2.4*   < > 2.8*  CL 93*   < > 100  CO2 27   < > 25  GLUCOSE 193*   < > 129*  BUN 17   < > 22*  CREATININE 1.17   < > 0.96  CALCIUM 9.3   < > 8.6*  MG  --   --  2.3  AST 26  --   --   ALT 36  --   --   ALKPHOS 100  --   --   BILITOT 1.1  --   --    < > =  values in this interval not displayed.   RADIOLOGY:  No results found. ASSESSMENT AND PLAN:  49 year old male known history of hyperlipidemia, hyperglycemia, hypertensionadmitted for cellulitis in his right leg  *Right lower extremity cellulitis - continue IV vancomycin, improving erythema   Hypokalemia Replete and recheck  Hypertension Continue home medications  Hyponatremia Improved with hydration   Status is: Inpatient  Remains inpatient appropriate because:IV treatments appropriate due to intensity of illness or inability to take PO   Dispo: The patient is from: Home              Anticipated d/c is to: Home              Anticipated d/c date is: 1 day              Patient currently is  not medically stable to d/c.    DVT prophylaxis: Lovenox Family Communication: updated wife at bedside   All the records are reviewed and case discussed with Care Management/Social Worker. Management plans discussed with the patient, family and they are in agreement.  CODE STATUS: Full Code  TOTAL TIME TAKING CARE OF THIS PATIENT: 35 minutes.   More than 50% of the time was spent in counseling/coordination of care: YES  POSSIBLE D/C IN 1 DAYS, DEPENDING ON CLINICAL CONDITION.   Max Sane M.D on 11/16/2019 at 7:54 PM  [facility]   CC: Primary care physician; Remi Haggard, FNP  Note: This dictation was prepared with Dragon dictation along with smaller phrase technology. Any transcriptional errors that result from this process are unintentional.

## 2019-11-16 NOTE — Consult Note (Addendum)
[facility] for Electrolyte Monitoring and Replacement   Recent Labs: Potassium (mmol/L)  Date Value  11/16/2019 2.8 (L)   Magnesium (mg/dL)  Date Value  11/16/2019 2.3   Calcium (mg/dL)  Date Value  11/16/2019 8.6 (L)   Albumin (g/dL)  Date Value  11/15/2019 4.1  10/14/2016 4.4   Sodium (mmol/L)  Date Value  11/16/2019 136  10/14/2016 141    Assessment: 49 y.o. male with a known history of hyperlipidemia, hyperglycemia, hypertension is being admitted for cellulitis in his right leg. Pharmacy has been asked to follow and replace electrolytes as needed. His potassium was 2.4 mmol/L on admission (11/15/19) and he received 1000 mL of IV 0.9% NaCl w/ 20 mEq/L and 40 mEq of oral KCl with some improvement  Goal of Therapy:  Electrolytes WNL  Plan:   Originally IV replacement was attempted, however, the patient was unable to tolerate even at reduced rates   replace with 40 mEq oral KCl x 3  Next electrolyte labs 4/17 am: pharmacy will replace electrolytes as needed  Dallie Piles ,PharmD Clinical Pharmacist 11/16/2019 12:51 PM

## 2019-11-16 NOTE — Consult Note (Signed)
Pharmacy Antibiotic Note  Andrew Bond is a 49 y.o. male admitted on 11/15/2019 with cellulitis.  Pharmacy has been consulted for Vancomycin dosing. He has failed outpatient levofloxacin and ceftriaxone. His renal function has improved slightly since admission, leucocytosis now resolved and no recent febrile episodes  Plan: adjust vancomycin to 1250 mg IV Q 12 hrs  Goal AUC 400-550  Expected AUC: 532.1  Expected Css: 34.5/14.9 ug/mL  SCr used: 0.96 (unclear baseline)  Levels will be drawn as clinically necessary   Height: 5\' 8"  (172.7 cm) Weight: 81.6 kg (180 lb) IBW/kg (Calculated) : 68.4  Temp (24hrs), Avg:98.4 F (36.9 C), Min:98.3 F (36.8 C), Max:98.6 F (37 C)  Recent Labs  Lab 11/15/19 1213 11/15/19 1530  WBC 14.3*  --   CREATININE 1.17  --   LATICACIDVEN 1.4 1.9    Estimated Creatinine Clearance: 74.7 mL/min (by C-G formula based on SCr of 1.17 mg/dL).    Allergies  Allergen Reactions  . Codeine Nausea And Vomiting    Antimicrobials this admission: Vancomycin 4/15 >>    Thank you for allowing pharmacy to be a part of this patient's care.  Dallie Piles 11/16/2019 7:05 AM

## 2019-11-17 DIAGNOSIS — E876 Hypokalemia: Secondary | ICD-10-CM | POA: Diagnosis not present

## 2019-11-17 DIAGNOSIS — L03115 Cellulitis of right lower limb: Secondary | ICD-10-CM | POA: Diagnosis not present

## 2019-11-17 DIAGNOSIS — L02415 Cutaneous abscess of right lower limb: Secondary | ICD-10-CM | POA: Diagnosis not present

## 2019-11-17 LAB — RENAL FUNCTION PANEL
Albumin: 3.3 g/dL — ABNORMAL LOW (ref 3.5–5.0)
Anion gap: 9 (ref 5–15)
BUN: 22 mg/dL — ABNORMAL HIGH (ref 6–20)
CO2: 26 mmol/L (ref 22–32)
Calcium: 9 mg/dL (ref 8.9–10.3)
Chloride: 107 mmol/L (ref 98–111)
Creatinine, Ser: 0.83 mg/dL (ref 0.61–1.24)
GFR calc Af Amer: >60 mL/min (ref >60)
GFR calc non Af Amer: >60 mL/min (ref >60)
Glucose, Bld: 118 mg/dL — ABNORMAL HIGH (ref 70–99)
Phosphorus: 3.1 mg/dL (ref 2.5–4.6)
Potassium: 3.9 mmol/L (ref 3.5–5.1)
Sodium: 142 mmol/L (ref 135–145)

## 2019-11-17 LAB — BASIC METABOLIC PANEL
Anion gap: 9 (ref 5–15)
BUN: 23 mg/dL — ABNORMAL HIGH (ref 6–20)
CO2: 26 mmol/L (ref 22–32)
Calcium: 8.7 mg/dL — ABNORMAL LOW (ref 8.9–10.3)
Chloride: 104 mmol/L (ref 98–111)
Creatinine, Ser: 0.86 mg/dL (ref 0.61–1.24)
GFR calc Af Amer: >60 mL/min (ref >60)
GFR calc non Af Amer: >60 mL/min (ref >60)
Glucose, Bld: 117 mg/dL — ABNORMAL HIGH (ref 70–99)
Potassium: 3.7 mmol/L (ref 3.5–5.1)
Sodium: 139 mmol/L (ref 135–145)

## 2019-11-17 LAB — CBC
HCT: 35.8 % — ABNORMAL LOW (ref 39.0–52.0)
Hemoglobin: 12.7 g/dL — ABNORMAL LOW (ref 13.0–17.0)
MCH: 31.4 pg (ref 26.0–34.0)
MCHC: 35.5 g/dL (ref 30.0–36.0)
MCV: 88.6 fL (ref 80.0–100.0)
Platelets: 454 10*3/uL — ABNORMAL HIGH (ref 150–400)
RBC: 4.04 MIL/uL — ABNORMAL LOW (ref 4.22–5.81)
RDW: 13.2 % (ref 11.5–15.5)
WBC: 10 10*3/uL (ref 4.0–10.5)
nRBC: 0 % (ref 0.0–0.2)

## 2019-11-17 LAB — MAGNESIUM: Magnesium: 2.4 mg/dL (ref 1.7–2.4)

## 2019-11-17 MED ORDER — CEPHALEXIN 250 MG PO CAPS: 250.0000 mg | ORAL_CAPSULE | Freq: Two times a day (BID) | ORAL | 0 refills | Status: AC

## 2019-11-17 NOTE — Consult Note (Signed)
[facility] for Electrolyte Monitoring and Replacement   Recent Labs: Potassium (mmol/L)  Date Value  11/17/2019 3.9  11/17/2019 3.7   Magnesium (mg/dL)  Date Value  11/17/2019 2.4   Calcium (mg/dL)  Date Value  11/17/2019 9.0  11/17/2019 8.7 (L)   Albumin (g/dL)  Date Value  11/17/2019 3.3 (L)  10/14/2016 4.4   Phosphorus (mg/dL)  Date Value  11/17/2019 3.1   Sodium (mmol/L)  Date Value  11/17/2019 142  11/17/2019 139  10/14/2016 141    Assessment: 49 y.o. male with a known history of hyperlipidemia, hyperglycemia, hypertension is being admitted for cellulitis in his right leg. Pharmacy has been asked to follow and replace electrolytes as needed.   Goal of Therapy:  Electrolytes WNL  Plan:  No replacement warranted at this time.   Next electrolyte labs 4/18 am: pharmacy will replace electrolytes as needed  Pernell Dupre, PharmD, BCPS Clinical Pharmacist 11/17/2019 7:40 AM

## 2019-11-17 NOTE — Progress Notes (Signed)
Andrew Bond to be D/C'd home per MD order.  Discussed prescriptions and follow up appointments with the patient. Prescriptions given to patient, medication list explained in detail. Pt verbalized understanding.  Allergies as of 11/17/2019      Reactions   Codeine Nausea And Vomiting      Medication List    STOP taking these medications   amLODipine 5 MG tablet Commonly known as: NORVASC   ciprofloxacin 500 MG tablet Commonly known as: CIPRO   hydrochlorothiazide 25 MG tablet Commonly known as: HYDRODIURIL     TAKE these medications   atorvastatin 40 MG tablet Commonly known as: LIPITOR Take 1 tablet (40 mg total) by mouth at bedtime.   cephALEXin 250 MG capsule Commonly known as: KEFLEX Take 1 capsule (250 mg total) by mouth 2 (two) times daily for 5 days.   chlorthalidone 50 MG tablet Commonly known as: HYGROTON Take 50 mg by mouth daily.   fluticasone 50 MCG/ACT nasal spray Commonly known as: FLONASE Place 2 sprays into both nostrils daily as needed.   loratadine 10 MG tablet Commonly known as: CLARITIN Take 10 mg by mouth daily as needed.   losartan 50 MG tablet Commonly known as: COZAAR Take 50 mg by mouth daily.   ranitidine 300 MG tablet Commonly known as: ZANTAC Take 300 mg by mouth daily as needed.       Vitals:   11/17/19 0426 11/17/19 1024  BP: 120/82 131/82  Pulse: 81 86  Resp: 16   Temp: 98.2 F (36.8 C)   SpO2: 99% 99%    Skin clean, dry and intact without evidence of skin break down, no evidence of skin tears noted. IV catheter discontinued intact. Site without signs and symptoms of complications. Dressing and pressure applied. Pt denies pain at this time. No complaints noted.  An After Visit Summary was printed and given to the patient. Patient escorted via [place], and D/C home via private auto.  Fuller Mandril, RN

## 2019-11-17 NOTE — Discharge Instructions (Signed)

## 2019-11-17 NOTE — Discharge Summary (Signed)
[facility] at [place] NAME: Andrew Bond    MR#:  [id number]  DATE OF BIRTH:  April 01, 1971  DATE OF ADMISSION:  11/15/2019   ADMITTING PHYSICIAN: Max Sane, MD  DATE OF DISCHARGE: 11/17/2019  3:17 PM  PRIMARY CARE PHYSICIAN: Remi Haggard, FNP   ADMISSION DIAGNOSIS:  Hypokalemia [E87.6] Cellulitis of right lower extremity [L03.115] Cellulitis and abscess of right leg [L03.115, L02.415] DISCHARGE DIAGNOSIS:  Active Problems:   Cellulitis of right lower extremity   Hypokalemia  SECONDARY DIAGNOSIS:   Past Medical History:  Diagnosis Date  . Allergic rhinitis due to pollen 04/01/2015  . Dyslipidemia 04/22/2015  . Essential hypertension, benign 04/01/2015  . Hyperglycemia 04/16/2016   HOSPITAL COURSE:  49 year old maleknown history ofhyperlipidemia, hyperglycemia, hypertensionadmitted forcellulitis in his right leg  *Right lower extremity cellulitis - Treated with IV vancomycin,erythema significantly improved.  Patient feeling close to his baseline  Hypokalemia Repleted and resolved  Hypertension Stable on home medications  Hyponatremia Improved with hydration   DISCHARGE CONDITIONS:  Stable CONSULTS OBTAINED:   DRUG ALLERGIES:   Allergies  Allergen Reactions  . Codeine Nausea And Vomiting   DISCHARGE MEDICATIONS:   Allergies as of 11/17/2019      Reactions   Codeine Nausea And Vomiting      Medication List    STOP taking these medications   amLODipine 5 MG tablet Commonly known as: NORVASC   ciprofloxacin 500 MG tablet Commonly known as: CIPRO   hydrochlorothiazide 25 MG tablet Commonly known as: HYDRODIURIL     TAKE these medications   atorvastatin 40 MG tablet Commonly known as: LIPITOR Take 1 tablet (40 mg total) by mouth at bedtime.   cephALEXin 250 MG capsule Commonly known as: KEFLEX Take 1 capsule (250 mg total) by mouth 2 (two) times daily for 5 days.   chlorthalidone 50 MG  tablet Commonly known as: HYGROTON Take 50 mg by mouth daily.   fluticasone 50 MCG/ACT nasal spray Commonly known as: FLONASE Place 2 sprays into both nostrils daily as needed.   loratadine 10 MG tablet Commonly known as: CLARITIN Take 10 mg by mouth daily as needed.   losartan 50 MG tablet Commonly known as: COZAAR Take 50 mg by mouth daily.   ranitidine 300 MG tablet Commonly known as: ZANTAC Take 300 mg by mouth daily as needed.      DISCHARGE INSTRUCTIONS:   DIET:  Regular diet DISCHARGE CONDITION:  Stable ACTIVITY:  Activity as tolerated OXYGEN:  Home Oxygen: No.  Oxygen Delivery: room air DISCHARGE LOCATION:  home   If you experience worsening of your admission symptoms, develop shortness of breath, life threatening emergency, suicidal or homicidal thoughts you must seek medical attention immediately by calling 911 or calling your MD immediately  if symptoms less severe.  You Must read complete instructions/literature along with all the possible adverse reactions/side effects for all the Medicines you take and that have been prescribed to you. Take any new Medicines after you have completely understood and accpet all the possible adverse reactions/side effects.   Please note  You were cared for by a hospitalist during your hospital stay. If you have any questions about your discharge medications or the care you received while you were in the hospital after you are discharged, you can call the unit and asked to speak with the hospitalist on call if the hospitalist that took care of you is not available. Once you are discharged,  your primary care physician will handle any further medical issues. Please note that NO REFILLS for any discharge medications will be authorized once you are discharged, as it is imperative that you return to your primary care physician (or establish a relationship with a primary care physician if you do not have one) for your aftercare needs so  that they can reassess your need for medications and monitor your lab values.    On the day of Discharge:  VITAL SIGNS:  Blood pressure 131/82, pulse 86, temperature 98.2 F (36.8 C), temperature source Oral, resp. rate 16, height 5\' 8"  (1.727 m), weight 81.6 kg, SpO2 99 %. PHYSICAL EXAMINATION:  GENERAL:  49 y.o.-year-old patient lying in the bed with no acute distress.  EYES: Pupils equal, round, reactive to light and accommodation. No scleral icterus. Extraocular muscles intact.  HEENT: Head atraumatic, normocephalic. Oropharynx and nasopharynx clear.  NECK:  Supple, no jugular venous distention. No thyroid enlargement, no tenderness.  LUNGS: Normal breath sounds bilaterally, no wheezing, rales,rhonchi or crepitation. No use of accessory muscles of respiration.  CARDIOVASCULAR: S1, S2 normal. No murmurs, rubs, or gallops.  ABDOMEN: Soft, non-tender, non-distended. Bowel sounds present. No organomegaly or mass.  EXTREMITIES: No pedal edema, cyanosis, or clubbing.  NEUROLOGIC: Cranial nerves II through XII are intact. Muscle strength 5/5 in all extremities. Sensation intact. Gait not checked.  PSYCHIATRIC: The patient is alert and oriented x 3.  SKIN: No obvious rash, lesion, or ulcer.  DATA REVIEW:   CBC Recent Labs  Lab 11/17/19 0245  WBC 10.0  HGB 12.7*  HCT 35.8*  PLT 454*    Chemistries  Recent Labs  Lab 11/15/19 1213 11/16/19 1229 11/17/19 0245  NA 133*   < > 142  139  K 2.4*   < > 3.9  3.7  CL 93*   < > 107  104  CO2 27   < > 26  26  GLUCOSE 193*   < > 118*  117*  BUN 17   < > 22*  23*  CREATININE 1.17   < > 0.83  0.86  CALCIUM 9.3   < > 9.0  8.7*  MG  --    < > 2.4  AST 26  --   --   ALT 36  --   --   ALKPHOS 100  --   --   BILITOT 1.1  --   --    < > = values in this interval not displayed.     Outpatient follow-up Follow-up Information    Remi Haggard, FNP. Schedule an appointment as soon as possible for a visit in 1 week(s).    Specialty: Family Medicine Contact information: [place] [address] [phone]            Management plans discussed with the patient, family and they are in agreement.  CODE STATUS: Full Code   TOTAL TIME TAKING CARE OF THIS PATIENT: 45 minutes.    Max Sane M.D on 11/17/2019 at 3:53 PM  [facility]   CC: Primary care physician; Remi Haggard, FNP   Note: This dictation was prepared with Dragon dictation along with smaller phrase technology. Any transcriptional errors that result from this process are unintentional.

## 2019-11-17 NOTE — Consult Note (Signed)
Pharmacy Antibiotic Note  Andrew Bond is a 49 y.o. male admitted on 11/15/2019 with cellulitis.  Pharmacy has been consulted for Vancomycin dosing. He has failed outpatient levofloxacin and ceftriaxone. His renal function has improved slightly since admission, leucocytosis now resolved and no recent febrile episodes  Day 3 IV antibiotics   Plan:  Continue vancomycin to 1250 mg IV Q 12 hrs  Goal AUC 400-550  Expected AUC: 463  Expected Css: 31.9/12.2 ug/mL  SCr used: 0.83 (unclear baseline)  Levels will be drawn as clinically necessary   Height: 5\' 8"  (172.7 cm) Weight: 81.6 kg (180 lb) IBW/kg (Calculated) : 68.4  Temp (24hrs), Avg:98 F (36.7 C), Min:97.7 F (36.5 C), Max:98.2 F (36.8 C)  Recent Labs  Lab 11/15/19 1213 11/15/19 1530 11/16/19 1229 11/17/19 0245  WBC 14.3*  --  10.4 10.0  CREATININE 1.17  --  0.96 0.83  0.86  LATICACIDVEN 1.4 1.9  --   --     Estimated Creatinine Clearance: 105.3 mL/min (by C-G formula based on SCr of 0.83 mg/dL).    Allergies  Allergen Reactions  . Codeine Nausea And Vomiting    Antimicrobials this admission: Vancomycin 4/15 >>   Thank you for allowing pharmacy to be a part of this patient's care.  Pernell Dupre, PharmD, BCPS Clinical Pharmacist 11/17/2019 7:57 AM

## 2019-11-20 LAB — CULTURE, BLOOD (ROUTINE X 2)
Culture: NO GROWTH
Culture: NO GROWTH
Special Requests: ADEQUATE
Special Requests: ADEQUATE

## 2020-03-29 ENCOUNTER — Ambulatory Visit: Admit: 2020-03-29 | Discharge status: Absent without leave | Payer: Managed Care, Other (non HMO)

## 2020-09-28 ENCOUNTER — Emergency Department
Admission: EM | Admit: 2020-09-28 | Discharge: 2020-09-28 | Disposition: A | Discharge status: Home / Self Care | Payer: BLUE CROSS/BLUE SHIELD | Attending: Emergency Medicine | Admitting: Emergency Medicine

## 2020-09-28 ENCOUNTER — Encounter: Payer: Self-pay | Admitting: Emergency Medicine

## 2020-09-28 ENCOUNTER — Other Ambulatory Visit: Payer: Self-pay

## 2020-09-28 DIAGNOSIS — K625 Hemorrhage of anus and rectum: Secondary | ICD-10-CM | POA: Insufficient documentation

## 2020-09-28 DIAGNOSIS — Z79899 Other long term (current) drug therapy: Secondary | ICD-10-CM | POA: Diagnosis not present

## 2020-09-28 DIAGNOSIS — I10 Essential (primary) hypertension: Secondary | ICD-10-CM | POA: Diagnosis not present

## 2020-09-28 DIAGNOSIS — Z87891 Personal history of nicotine dependence: Secondary | ICD-10-CM | POA: Insufficient documentation

## 2020-09-28 LAB — CBC
HCT: 44.8 % (ref 39.0–52.0)
Hemoglobin: 15.9 g/dL (ref 13.0–17.0)
MCH: 30.8 pg (ref 26.0–34.0)
MCHC: 35.5 g/dL (ref 30.0–36.0)
MCV: 86.7 fL (ref 80.0–100.0)
Platelets: 516 10*3/uL — ABNORMAL HIGH (ref 150–400)
RBC: 5.17 MIL/uL (ref 4.22–5.81)
RDW: 12.6 % (ref 11.5–15.5)
WBC: 11 10*3/uL — ABNORMAL HIGH (ref 4.0–10.5)
nRBC: 0 % (ref 0.0–0.2)

## 2020-09-28 NOTE — ED Provider Notes (Signed)
[facility] EMERGENCY DEPARTMENT Provider Note   CSN: [id number] Arrival date & time: 09/28/20  1349     History Chief Complaint  Patient presents with  . Rectal Bleeding    Andrew Bond is a 50 y.o. male presents to the emergency department for evaluation of bright red rectal bleeding.  Patient states that 3 mornings ago he was having a bowel movement and noticed bright red blood with wiping.  He denies any blood within the stool.  No rectal pain nor abdominal pain.  Patient states he typically has a couple bowel movements during the day but only his morning bowel movements will produce some bright red blood.  He is not on any blood thinners.  Denies any significant constipation, straining.  HPI     Past Medical History:  Diagnosis Date  . Allergic rhinitis due to pollen 04/01/2015  . Dyslipidemia 04/22/2015  . Essential hypertension, benign 04/01/2015  . Hyperglycemia 04/16/2016    Patient Active Problem List   Diagnosis Date Noted  . Hypokalemia   . Cellulitis of right lower extremity 11/15/2019  . Overweight (BMI 25.0-29.9) 01/10/2018  . Neck pain, chronic 10/14/2016  . Hyperglycemia 04/16/2016  . Chronic cough 09/24/2015  . Medication monitoring encounter 04/22/2015  . Dyslipidemia 04/22/2015  . Essential hypertension, benign 04/01/2015  . Allergic rhinitis due to pollen 04/01/2015  . Preventative health care 04/01/2015    Past Surgical History:  Procedure Laterality Date  . HERNIA REPAIR     x6  . NM RENAL LASIX ([facility] HX)    . REFRACTIVE SURGERY    . TOE SURGERY    . WRIST SURGERY Bilateral    carpal tunnel       Family History  Problem Relation Age of Onset  . Hypertension Mother   . Cancer Mother        cervical  . Heart disease Father   . Heart attack Father   . Hypertension Brother   . Hyperlipidemia Brother   . Cancer Maternal Grandmother        lymph nodes, spread to lungs    Social History   Tobacco  Use  . Smoking status: Former Smoker    Packs/day: 0.50    Years: 10.00    Pack years: 5.00    Types: Cigarettes    Quit date: 08/02/2014    Years since quitting: 6.1  . Smokeless tobacco: Never Used  Vaping Use  . Vaping Use: Never used  Substance Use Topics  . Alcohol use: No  . Drug use: No    Home Medications Prior to Admission medications   Medication Sig Start Date End Date Taking? Authorizing Provider  atorvastatin (LIPITOR) 40 MG tablet Take 1 tablet (40 mg total) by mouth at bedtime. 01/10/18   Arnetha Courser, MD  chlorthalidone (HYGROTON) 50 MG tablet Take 50 mg by mouth daily.  10/20/19   [provider]  fluticasone (FLONASE) 50 MCG/ACT nasal spray Place 2 sprays into both nostrils daily as needed. 01/12/18   Arnetha Courser, MD  loratadine (CLARITIN) 10 MG tablet Take 10 mg by mouth daily as needed.     [provider]  losartan (COZAAR) 50 MG tablet Take 50 mg by mouth daily. 10/11/19   [provider]  ranitidine (ZANTAC) 300 MG tablet Take 300 mg by mouth daily as needed.  08/26/15 08/25/16  [provider]    Allergies    Codeine  Review of Systems   Review  of Systems  Constitutional: Negative for chills and fever.  Respiratory: Negative for chest tightness.   Cardiovascular: Negative for chest pain.  Gastrointestinal: Positive for anal bleeding. Negative for abdominal distention, abdominal pain, blood in stool, constipation, diarrhea, nausea, rectal pain and vomiting.  Genitourinary: Negative for dysuria and flank pain.  Skin: Negative for rash and wound.  Neurological: Negative for dizziness and headaches.    Physical Exam Updated Vital Signs BP (!) 164/107   Pulse 91   Temp 98 F (36.7 C) (Oral)   Resp 16   Ht 5\' 7"  (1.702 m)   Wt 86.2 kg   SpO2 99%   BMI 29.76 kg/m   Physical Exam Constitutional:      Appearance: He is well-developed and well-nourished.  HENT:     Head: Normocephalic and atraumatic.  Eyes:      Conjunctiva/sclera: Conjunctivae normal.  Cardiovascular:     Rate and Rhythm: Normal rate.  Pulmonary:     Effort: Pulmonary effort is normal. No respiratory distress.  Abdominal:     General: Abdomen is flat. Bowel sounds are normal. There is no distension.     Palpations: Abdomen is soft.     Tenderness: There is no abdominal tenderness. There is no guarding.  Genitourinary:    Rectum: Normal.     Comments: No external hemorrhoids or noted fissures.  Nontender.  No signs of perirectal abscess. Musculoskeletal:        General: Normal range of motion.     Cervical back: Normal range of motion.  Skin:    General: Skin is warm.     Findings: No rash.  Neurological:     Mental Status: He is alert and oriented to person, place, and time.  Psychiatric:        Mood and Affect: Mood and affect normal.        Behavior: Behavior normal.        Thought Content: Thought content normal.     ED Results / Procedures / Treatments   Labs (all labs ordered are listed, but only abnormal results are displayed) Labs Reviewed  CBC    EKG None  Radiology No results found.  Procedures Procedures   Medications Ordered in ED Medications - No data to display  ED Course  I have reviewed the triage vital signs and the nursing notes.  Pertinent labs & imaging results that were available during my care of the patient were reviewed by me and considered in my medical decision making (see chart for details).    MDM Rules/Calculators/A&P                          50 year old male with bright red rectal bleeding, no pain.  Blood only with morning bowel movements.  No blood with afternoon BM.  He denies any abdominal pain.  Vital signs are stable.  CBC normal, hemoglobin 15.9.  Plan is for patient to be discharged with outpatient follow-up with GI.  Recommended stool softeners.  He understands signs and symptoms return to the ER for. Final Clinical Impression(s) / ED Diagnoses Final diagnoses:   Rectal bleeding    Rx / DC Orders ED Discharge Orders    None       Duanne Guess, PA-C 09/28/20 1447    Delman Kitten, MD 09/28/20 [phone]

## 2020-09-28 NOTE — ED Triage Notes (Signed)
Pt sent to ED from [facility] in for rectal bleeding since Thursday. Pt states that he is having some rectal irritation. Pt states that the blood does not appear to be in his stool but just in the toilet. Pt states that he does not have blood with every stool. Pt states that his stools have been normal. Pt is in NAD.

## 2020-09-28 NOTE — Discharge Instructions (Addendum)
Please make sure you are drinking lots of fluids.  Increase fiber in your diet.  Take a stool softener daily such as MiraLAX.  Call gastroenterologist to schedule follow-up appointment.  Return to the ER for any worsening symptoms or new changes in your health.

## 2020-09-28 NOTE — ED Notes (Signed)
Patient declined discharge vital signs. 

## 2020-10-16 ENCOUNTER — Ambulatory Visit (INDEPENDENT_AMBULATORY_CARE_PROVIDER_SITE_OTHER): Payer: BC Managed Care – PPO | Admitting: Gastroenterology

## 2020-10-16 ENCOUNTER — Other Ambulatory Visit: Payer: Self-pay

## 2020-10-16 VITALS — BP 141/87 | HR 96 | Ht 68.0 in | Wt 200.0 lb

## 2020-10-16 DIAGNOSIS — K219 Gastro-esophageal reflux disease without esophagitis: Secondary | ICD-10-CM

## 2020-10-16 DIAGNOSIS — K625 Hemorrhage of anus and rectum: Secondary | ICD-10-CM

## 2020-10-16 MED ORDER — OMEPRAZOLE 20 MG PO CPDR: 20.0000 mg | DELAYED_RELEASE_CAPSULE | Freq: Every day | ORAL | 0 refills | Status: AC

## 2020-10-16 MED ORDER — NA SULFATE-K SULFATE-MG SULF 17.5-3.13-1.6 GM/177ML PO SOLN: 1.0000 | Freq: Once | ORAL | 0 refills | Status: AC

## 2020-10-16 NOTE — Progress Notes (Signed)
Jonathon Bellows MD, MRCP(U.K) [address]  [place]  [place], [place] [address]  Main: [phone]  Fax: [phone]   Gastroenterology Consultation  Referring Provider:    Emergency room referral  primary Care Physician:  Remi Haggard, FNP Primary Gastroenterologist:  Dr. Jonathon Bellows  Reason for Consultation:     Rectal bleeding        HPI:   Andrew Bond is a 50 y.o. y/o male who was seen on 09/28/2020 in the emergency room for rectal bleeding.  He had been noticing blood in the toilet after wiping.  He was referred to see me for a colonoscopy as an outpatient.  He has a history of 5 pack years of smoking and quit in 2016.In the emergency room his hemoglobin is 15.9 g.  He states that around the time of his ER visit he had some on and off painless rectal bleeding associated with bowel movements.  Denies any perianal itching, constipation, change in bowel habits, weight loss, change in shape of stool.  No prior colonoscopy.  He had a relative with had colon cancer.  Denies any other symptoms.  He has had some issues with heartburn which usually occurs at night when he lays flat.  Not tried any medications.  Has gained weight recently.  Does have a wedge pillow at home.  His wife is also my patient and was present in the room during the visit  Past Medical History:  Diagnosis Date  . Allergic rhinitis due to pollen 04/01/2015  . Dyslipidemia 04/22/2015  . Essential hypertension, benign 04/01/2015  . Hyperglycemia 04/16/2016    Past Surgical History:  Procedure Laterality Date  . HERNIA REPAIR     x6  . NM RENAL LASIX ([place] HX)    . REFRACTIVE SURGERY    . TOE SURGERY    . WRIST SURGERY Bilateral    carpal tunnel    Prior to Admission medications   Medication Sig Start Date End Date Taking? Authorizing Provider  atorvastatin (LIPITOR) 40 MG tablet Take 1 tablet (40 mg total) by mouth at bedtime. 01/10/18   Arnetha Courser, MD  chlorthalidone (HYGROTON) 50  MG tablet Take 50 mg by mouth daily.  10/20/19   [provider]  fluticasone (FLONASE) 50 MCG/ACT nasal spray Place 2 sprays into both nostrils daily as needed. 01/12/18   Arnetha Courser, MD  loratadine (CLARITIN) 10 MG tablet Take 10 mg by mouth daily as needed.     [provider]  losartan (COZAAR) 50 MG tablet Take 50 mg by mouth daily. 10/11/19   [provider]  ranitidine (ZANTAC) 300 MG tablet Take 300 mg by mouth daily as needed.  08/26/15 08/25/16  [provider]    Family History  Problem Relation Age of Onset  . Hypertension Mother   . Cancer Mother        cervical  . Heart disease Father   . Heart attack Father   . Hypertension Brother   . Hyperlipidemia Brother   . Cancer Maternal Grandmother        lymph nodes, spread to lungs     Social History   Tobacco Use  . Smoking status: Former Smoker    Packs/day: 0.50    Years: 10.00    Pack years: 5.00    Types: Cigarettes    Quit date: 08/02/2014    Years since quitting: 6.2  . Smokeless tobacco: Never Used  Vaping Use  .  Vaping Use: Never used  Substance Use Topics  . Alcohol use: No  . Drug use: No    Allergies as of 10/16/2020 - Review Complete 09/28/2020  Allergen Reaction Noted  . Codeine Nausea And Vomiting 03/29/2015    Review of Systems:    All systems reviewed and negative except where noted in HPI.   Physical Exam:  There were no vitals taken for this visit. No LMP for male patient. Psych:  Alert and cooperative. Normal mood and affect. General:   Alert,  Well-developed, well-nourished, pleasant and cooperative in NAD Head:  Normocephalic and atraumatic. Eyes:  Sclera clear, no icterus.   Conjunctiva pink.   Neurologic:  Alert and oriented x3;  grossly normal neurologically. Psych:  Alert and cooperative. Normal mood and affect.  Imaging Studies: No results found.  Assessment and Plan:   Andrew Bond is a 50 y.o. y/o male has been referred  for rectal bleeding.  He will be set up for a diagnostic colonoscopy.  I have discussed about high-fiber diet with an aim to at least 25 g of fiber per day.  Patient information provided.  Discussed about conservative management of internal hemorrhoids.  Patient information provided.  History of GERD will commence on Prilosec 20 mg once a day first thing in the morning on an empty stomach discussed about lifestyle changes for GERD at next visit we will try to take him off PPI if adhering to lifestyle changes.  I have discussed alternative options, risks & benefits,  which include, but are not limited to, bleeding, infection, perforation,respiratory complication & drug reaction.  The patient agrees with this plan & written consent will be obtained.     Follow up in 3 months  Dr Jonathon Bellows MD,MRCP(U.K)

## 2020-10-16 NOTE — Patient Instructions (Signed)
Hemorrhoids Hemorrhoids are swollen veins that may develop:  In the butt (rectum). These are called internal hemorrhoids.  Around the opening of the butt (anus). These are called external hemorrhoids. Hemorrhoids can cause pain, itching, or bleeding. Most of the time, they do not cause serious problems. They usually get better with diet changes, lifestyle changes, and other home treatments. What are the causes? This condition may be caused by:  Having trouble pooping (constipation).  Pushing hard (straining) to poop.  Watery poop (diarrhea).  Pregnancy.  Being very overweight (obese).  Sitting for long periods of time.  Heavy lifting or other activity that causes you to strain.  Anal sex.  Riding a bike for a long period of time. What are the signs or symptoms? Symptoms of this condition include:  Pain.  Itching or soreness in the butt.  Bleeding from the butt.  Leaking poop.  Swelling in the area.  One or more lumps around the opening of your butt. How is this diagnosed? A doctor can often diagnose this condition by looking at the affected area. The doctor may also:  Do an exam that involves feeling the area with a gloved hand (digital rectal exam).  Examine the area inside your butt using a small tube (anoscope).  Order blood tests. This may be done if you have lost a lot of blood.  Have you get a test that involves looking inside the colon using a flexible tube with a camera on the end (sigmoidoscopy or colonoscopy). How is this treated? This condition can usually be treated at home. Your doctor may tell you to change what you eat, make lifestyle changes, or try home treatments. If these do not help, procedures can be done to remove the hemorrhoids or make them smaller. These may involve:  Placing rubber bands at the base of the hemorrhoids to cut off their blood supply.  Injecting medicine into the hemorrhoids to shrink them.  Shining a type of light  energy onto the hemorrhoids to cause them to fall off.  Doing surgery to remove the hemorrhoids or cut off their blood supply. Follow these instructions at home: Eating and drinking  Eat foods that have a lot of fiber in them. These include whole grains, beans, nuts, fruits, and vegetables.  Ask your doctor about taking products that have added fiber (fibersupplements).  Reduce the amount of fat in your diet. You can do this by: ? Eating low-fat dairy products. ? Eating less red meat. ? Avoiding processed foods.  Drink enough fluid to keep your pee (urine) pale yellow.   Managing pain and swelling  Take a warm-water bath (sitz bath) for 20 minutes to ease pain. Do this 3-4 times a day. You may do this in a bathtub or using a portable sitz bath that fits over the toilet.  If told, put ice on the painful area. It may be helpful to use ice between your warm baths. ? Put ice in a plastic bag. ? Place a towel between your skin and the bag. ? Leave the ice on for 20 minutes, 2-3 times a day.   General instructions  Take over-the-counter and prescription medicines only as told by your doctor. ? Medicated creams and medicines may be used as told.  Exercise often. Ask your doctor how much and what kind of exercise is best for you.  Go to the bathroom when you have the urge to poop. Do not wait.  Avoid pushing too hard when you poop.  Keep your butt dry and clean. Use wet toilet paper or moist towelettes after pooping.  Do not sit on the toilet for a long time.  Keep all follow-up visits as told by your doctor. This is important. Contact a doctor if you:  Have pain and swelling that do not get better with treatment or medicine.  Have trouble pooping.  Cannot poop.  Have pain or swelling outside the area of the hemorrhoids. Get help right away if you have:  Bleeding that will not stop. Summary  Hemorrhoids are swollen veins in the butt or around the opening of the  butt.  They can cause pain, itching, or bleeding.  Eat foods that have a lot of fiber in them. These include whole grains, beans, nuts, fruits, and vegetables.  Take a warm-water bath (sitz bath) for 20 minutes to ease pain. Do this 3-4 times a day. This information is not intended to replace advice given to you by your health care provider. Make sure you discuss any questions you have with your health care provider. Document Revised: 07/27/2018 Document Reviewed: 12/08/2017 Elsevier Patient Education  [place].

## 2020-10-17 ENCOUNTER — Other Ambulatory Visit
Admission: RE | Admit: 2020-10-17 | Discharge: 2020-10-17 | Disposition: A | Discharge status: Home / Self Care | Payer: BC Managed Care – PPO | Source: Ambulatory Visit | Attending: Gastroenterology | Admitting: Gastroenterology

## 2020-10-17 DIAGNOSIS — Z01812 Encounter for preprocedural laboratory examination: Secondary | ICD-10-CM | POA: Diagnosis present

## 2020-10-17 DIAGNOSIS — Z20822 Contact with and (suspected) exposure to covid-19: Secondary | ICD-10-CM | POA: Insufficient documentation

## 2020-10-18 LAB — SARS CORONAVIRUS 2 (TAT 6-24 HRS): SARS Coronavirus 2: NEGATIVE

## 2020-10-20 ENCOUNTER — Encounter: Payer: Self-pay | Admitting: Gastroenterology

## 2020-10-21 ENCOUNTER — Ambulatory Visit: Payer: BC Managed Care – PPO | Admitting: Anesthesiology

## 2020-10-21 ENCOUNTER — Other Ambulatory Visit: Payer: Self-pay

## 2020-10-21 ENCOUNTER — Encounter
Admission: RE | Disposition: A | Discharge status: Home / Self Care | Payer: Self-pay | Source: Home / Self Care | Attending: Gastroenterology

## 2020-10-21 ENCOUNTER — Encounter: Payer: Self-pay | Admitting: Gastroenterology

## 2020-10-21 ENCOUNTER — Ambulatory Visit
Admission: RE | Admit: 2020-10-21 | Discharge: 2020-10-21 | Disposition: A | Discharge status: Home / Self Care | Payer: BC Managed Care – PPO | Attending: Gastroenterology | Admitting: Gastroenterology

## 2020-10-21 DIAGNOSIS — K635 Polyp of colon: Secondary | ICD-10-CM

## 2020-10-21 DIAGNOSIS — D122 Benign neoplasm of ascending colon: Secondary | ICD-10-CM | POA: Insufficient documentation

## 2020-10-21 DIAGNOSIS — K640 First degree hemorrhoids: Secondary | ICD-10-CM | POA: Insufficient documentation

## 2020-10-21 DIAGNOSIS — Z87891 Personal history of nicotine dependence: Secondary | ICD-10-CM | POA: Diagnosis not present

## 2020-10-21 DIAGNOSIS — Z8249 Family history of ischemic heart disease and other diseases of the circulatory system: Secondary | ICD-10-CM | POA: Diagnosis not present

## 2020-10-21 DIAGNOSIS — Z79899 Other long term (current) drug therapy: Secondary | ICD-10-CM | POA: Diagnosis not present

## 2020-10-21 DIAGNOSIS — K621 Rectal polyp: Secondary | ICD-10-CM | POA: Insufficient documentation

## 2020-10-21 DIAGNOSIS — Z885 Allergy status to narcotic agent status: Secondary | ICD-10-CM | POA: Insufficient documentation

## 2020-10-21 DIAGNOSIS — Z801 Family history of malignant neoplasm of trachea, bronchus and lung: Secondary | ICD-10-CM | POA: Diagnosis not present

## 2020-10-21 DIAGNOSIS — Z8349 Family history of other endocrine, nutritional and metabolic diseases: Secondary | ICD-10-CM | POA: Diagnosis not present

## 2020-10-21 DIAGNOSIS — K625 Hemorrhage of anus and rectum: Secondary | ICD-10-CM

## 2020-10-21 DIAGNOSIS — I10 Essential (primary) hypertension: Secondary | ICD-10-CM | POA: Insufficient documentation

## 2020-10-21 DIAGNOSIS — Z8049 Family history of malignant neoplasm of other genital organs: Secondary | ICD-10-CM | POA: Insufficient documentation

## 2020-10-21 HISTORY — PX: COLONOSCOPY WITH PROPOFOL: SHX5780

## 2020-10-21 HISTORY — DX: Other complications of anesthesia, initial encounter: T88.59XA

## 2020-10-21 SURGERY — COLONOSCOPY WITH PROPOFOL
Anesthesia: General

## 2020-10-21 MED ORDER — MIDAZOLAM HCL 2 MG/2ML IJ SOLN
INTRAMUSCULAR | Status: AC
  Filled 2020-10-21: qty 2

## 2020-10-21 MED ORDER — EPHEDRINE 5 MG/ML INJ
INTRAVENOUS | Status: AC
  Filled 2020-10-21: qty 10

## 2020-10-21 MED ORDER — SODIUM CHLORIDE 0.9 % IV SOLN: INTRAVENOUS | Status: DC

## 2020-10-21 MED ORDER — GLYCOPYRROLATE 0.2 MG/ML IJ SOLN
INTRAMUSCULAR | Status: DC | PRN
  Administered 2020-10-21: .2 mg via INTRAVENOUS

## 2020-10-21 MED ORDER — PROPOFOL 10 MG/ML IV BOLUS
INTRAVENOUS | Status: DC | PRN
  Administered 2020-10-21: 20 mg via INTRAVENOUS
  Administered 2020-10-21: 60 mg via INTRAVENOUS

## 2020-10-21 MED ORDER — MIDAZOLAM HCL 2 MG/2ML IJ SOLN
INTRAMUSCULAR | Status: DC | PRN
  Administered 2020-10-21: 2 mg via INTRAVENOUS

## 2020-10-21 MED ORDER — PROPOFOL 500 MG/50ML IV EMUL
INTRAVENOUS | Status: DC | PRN
  Administered 2020-10-21: 170 ug/kg/min via INTRAVENOUS

## 2020-10-21 MED ORDER — LIDOCAINE HCL (CARDIAC) PF 100 MG/5ML IV SOSY
PREFILLED_SYRINGE | INTRAVENOUS | Status: DC | PRN
  Administered 2020-10-21: 100 mg via INTRAVENOUS

## 2020-10-21 MED ORDER — LIDOCAINE HCL (PF) 2 % IJ SOLN
INTRAMUSCULAR | Status: AC
  Filled 2020-10-21: qty 30

## 2020-10-21 MED ORDER — ACETAMINOPHEN 10 MG/ML IV SOLN
INTRAVENOUS | Status: AC
  Filled 2020-10-21: qty 100

## 2020-10-21 MED ORDER — GLYCOPYRROLATE 0.2 MG/ML IJ SOLN
INTRAMUSCULAR | Status: AC
  Filled 2020-10-21: qty 6

## 2020-10-21 NOTE — Anesthesia Preprocedure Evaluation (Addendum)
Anesthesia Evaluation  Patient identified by MRN, date of birth, ID band Patient awake    Reviewed: Allergy & Precautions, H&P , NPO status , Patient's Chart, lab work & pertinent test results  History of Anesthesia Complications History of anesthetic complications: "slow to wake up"  Airway Mallampati: III  TM Distance: >3 FB     Dental  (+) Teeth Intact   Pulmonary sleep apnea (suspected) , neg COPD, former smoker,    breath sounds clear to auscultation       Cardiovascular hypertension, (-) angina(-) Past MI and (-) Cardiac Stents (-) dysrhythmias  Rhythm:regular Rate:Normal     Neuro/Psych negative neurological ROS  negative psych ROS   GI/Hepatic negative GI ROS, Neg liver ROS,   Endo/Other  negative endocrine ROS  Renal/GU negative Renal ROS  negative genitourinary   Musculoskeletal   Abdominal   Peds  Hematology negative hematology ROS (+)   Anesthesia Other Findings Past Medical History: 04/01/2015: Allergic rhinitis due to pollen No date: Complication of anesthesia     Comment:  hard time waking up when I was 50 years old, carpal               tunnel surgery 04/22/2015: Dyslipidemia 04/01/2015: Essential hypertension, benign 04/16/2016: Hyperglycemia  Past Surgical History: No date: EYE SURGERY No date: HERNIA REPAIR     Comment:  x6 No date: NM RENAL LASIX ([facility] HX) No date: REFRACTIVE SURGERY No date: TOE SURGERY No date: WRIST SURGERY; Bilateral     Comment:  carpal tunnel     Reproductive/Obstetrics negative OB ROS                            Anesthesia Physical Anesthesia Plan  ASA: II  Anesthesia Plan: General   Post-op Pain Management:    Induction:   PONV Risk Score and Plan: Propofol infusion and TIVA  Airway Management Planned: Simple Face Mask  Additional Equipment:   Intra-op Plan:   Post-operative Plan:   Informed Consent: I have reviewed the  patients History and Physical, chart, labs and discussed the procedure including the risks, benefits and alternatives for the proposed anesthesia with the patient or authorized representative who has indicated his/her understanding and acceptance.     Dental Advisory Given  Plan Discussed with: Anesthesiologist, CRNA and Surgeon  Anesthesia Plan Comments:        Anesthesia Quick Evaluation

## 2020-10-21 NOTE — H&P (Signed)
Andrew Bellows, MD [address], [place], [place], [place], [address] [address], [place], [place], [place], [address] Phone: [phone]  Fax: [phone]  Primary Care Physician:  Remi Haggard, FNP   Pre-Procedure History & Physical: HPI:  Andrew Bond is a 50 y.o. male is here for an colonoscopy.   Past Medical History:  Diagnosis Date  . Allergic rhinitis due to pollen 04/01/2015  . Complication of anesthesia    hard time waking up when I was 50 years old, carpal tunnel surgery  . Dyslipidemia 04/22/2015  . Essential hypertension, benign 04/01/2015  . Hyperglycemia 04/16/2016    Past Surgical History:  Procedure Laterality Date  . EYE SURGERY    . HERNIA REPAIR     x6  . NM RENAL LASIX ([place] HX)    . REFRACTIVE SURGERY    . TOE SURGERY    . WRIST SURGERY Bilateral    carpal tunnel    Prior to Admission medications   Medication Sig Start Date End Date Taking? Authorizing Provider  atorvastatin (LIPITOR) 40 MG tablet Take 1 tablet (40 mg total) by mouth at bedtime. 01/10/18  Yes Lada, Satira Anis, MD  chlorthalidone (HYGROTON) 50 MG tablet Take 50 mg by mouth daily.  10/20/19  Yes [provider]  loratadine (CLARITIN) 10 MG tablet Take 10 mg by mouth daily as needed.    Yes [provider]  losartan (COZAAR) 50 MG tablet Take 50 mg by mouth daily. 10/11/19  Yes [provider]  omeprazole (PRILOSEC) 20 MG capsule Take 1 capsule (20 mg total) by mouth daily. 10/16/20  Yes Andrew Bellows, MD  fluticasone [facility]) 50 MCG/ACT nasal spray Place 2 sprays into both nostrils daily as needed. 01/12/18   Arnetha Courser, MD  ranitidine (ZANTAC) 300 MG tablet Take 300 mg by mouth daily as needed.  08/26/15 08/25/16  [provider]    Allergies as of 10/16/2020 - Review Complete 10/16/2020  Allergen Reaction Noted  . Codeine Nausea And Vomiting 03/29/2015    Family History  Problem Relation Age of Onset  . Hypertension  Mother   . Cancer Mother        cervical  . Heart disease Father   . Heart attack Father   . Hypertension Brother   . Hyperlipidemia Brother   . Cancer Maternal Grandmother        lymph nodes, spread to lungs    Social History   Socioeconomic History  . Marital status: Married    Spouse name: Not on file  . Number of children: Not on file  . Years of education: Not on file  . Highest education level: Not on file  Occupational History  . Not on file  Tobacco Use  . Smoking status: Former Smoker    Packs/day: 0.50    Years: 10.00    Pack years: 5.00    Types: Cigarettes    Quit date: 08/02/2014    Years since quitting: 6.2  . Smokeless tobacco: Never Used  Vaping Use  . Vaping Use: Never used  Substance and Sexual Activity  . Alcohol use: No  . Drug use: No  . Sexual activity: Yes  Other Topics Concern  . Not on file  Social History Narrative  . Not on file   Social Determinants of Health   Financial Resource Strain: Not on file  Food Insecurity: Not on file  Transportation Needs: Not on file  Physical Activity: Not  on file  Stress: Not on file  Social Connections: Not on file  Intimate Partner Violence: Not on file    Review of Systems: See HPI, otherwise negative ROS  Physical Exam: BP (!) 148/90   Temp 98.1 F (36.7 C) (Oral)   Resp 18   Ht 5' 7.5" (1.715 m)   Wt 90.7 kg   SpO2 98%   BMI 30.86 kg/m  General:   Alert,  pleasant and cooperative in NAD Head:  Normocephalic and atraumatic. Neck:  Supple; no masses or thyromegaly. Lungs:  Clear throughout to auscultation, normal respiratory effort.    Heart:  +S1, +S2, Regular rate and rhythm, No edema. Abdomen:  Soft, nontender and nondistended. Normal bowel sounds, without guarding, and without rebound.   Neurologic:  Alert and  oriented x4;  grossly normal neurologically.  Impression/Plan: Lemont Fillers Bond is here for an colonoscopy to be performed for rectal bleeding. Risks, benefits,  limitations, and alternatives regarding  colonoscopy have been reviewed with the patient.  Questions have been answered.  All parties agreeable.   Andrew Bellows, MD  10/21/2020, 7:49 AM

## 2020-10-21 NOTE — Anesthesia Procedure Notes (Signed)
Procedure Name: General with mask airway Performed by: Fletcher-Harrison, Kristofer Schaffert, CRNA Pre-anesthesia Checklist: Patient identified, Emergency Drugs available, Suction available and Patient being monitored Patient Re-evaluated:Patient Re-evaluated prior to induction Oxygen Delivery Method: Simple face mask Induction Type: IV induction Placement Confirmation: positive ETCO2 and CO2 detector Dental Injury: Teeth and Oropharynx as per pre-operative assessment        

## 2020-10-21 NOTE — Anesthesia Postprocedure Evaluation (Signed)
Anesthesia Post Note  Patient: Andrew Bond  Procedure(s) Performed: COLONOSCOPY WITH PROPOFOL (N/A )  Patient location during evaluation: PACU Anesthesia Type: General Level of consciousness: awake and alert Pain management: pain level controlled Vital Signs Assessment: post-procedure vital signs reviewed and stable Respiratory status: spontaneous breathing, nonlabored ventilation and respiratory function stable Cardiovascular status: blood pressure returned to baseline and stable Postop Assessment: no apparent nausea or vomiting Anesthetic complications: no   No complications documented.   Last Vitals:  Vitals:   10/21/20 0826 10/21/20 0846  BP: 130/85 122/81  Pulse:    Resp:    Temp:    SpO2:  99%    Last Pain:  Vitals:   10/21/20 0846  TempSrc:   PainSc: 0-No pain                 Tera Mater

## 2020-10-21 NOTE — Progress Notes (Signed)
At no risk at this time. 

## 2020-10-21 NOTE — Transfer of Care (Signed)
Immediate Anesthesia Transfer of Care Note  Patient: Andrew Bond  Procedure(s) Performed: COLONOSCOPY WITH PROPOFOL (N/A )  Patient Location: Endoscopy Unit  Anesthesia Type:General  Level of Consciousness: drowsy and responds to stimulation  Airway & Oxygen Therapy: Patient Spontanous Breathing and Patient connected to face mask oxygen  Post-op Assessment: Report given to RN and Post -op Vital signs reviewed and stable  Post vital signs: Reviewed and stable  Last Vitals:  Vitals Value Taken Time  BP 118/85 10/21/20 0816  Temp 36.8 C 10/21/20 0816  Pulse 83 10/21/20 0819  Resp 16 10/21/20 0819  SpO2 98 % 10/21/20 0819  Vitals shown include unvalidated device data.  Last Pain:  Vitals:   10/21/20 0816  TempSrc: Temporal  PainSc: Asleep         Complications: No complications documented.

## 2020-10-21 NOTE — Op Note (Signed)
[facility] Gastroenterology Patient Name: Andrew Bond Procedure Date: 10/21/2020 7:20 AM MRN: [id number] Account #: [id number] Date of Birth: 03-Sep-1970 Admit Type: Outpatient Age: 50 Room: [facility] ENDO ROOM 2 Gender: Male Note Status: Finalized Procedure:             Colonoscopy Indications:           Rectal bleeding Providers:             Jonathon Bellows MD, MD Medicines:             Monitored Anesthesia Care Complications:         No immediate complications. Procedure:             Pre-Anesthesia Assessment:                        - Prior to the procedure, a History and Physical was                         performed, and patient medications, allergies and                         sensitivities were reviewed. The patient's tolerance                         of previous anesthesia was reviewed.                        - The risks and benefits of the procedure and the                         sedation options and risks were discussed with the                         patient. All questions were answered and informed                         consent was obtained.                        - ASA Grade Assessment: II - A patient with mild                         systemic disease.                        After obtaining informed consent, the colonoscope was                         passed under direct vision. Throughout the procedure,                         the patient's blood pressure, pulse, and oxygen                         saturations were monitored continuously. The                         Colonoscope was introduced through the anus and  advanced to the the cecum, identified by the                         appendiceal orifice. The colonoscopy was performed                         with ease. The patient tolerated the procedure well.                         The quality of the bowel preparation was excellent. Findings:      The perianal and digital rectal  examinations were normal.      Non-bleeding internal hemorrhoids were found during retroflexion. The       hemorrhoids were medium-sized and Grade I (internal hemorrhoids that do       not prolapse).      A 8 mm polyp was found in the ascending colon. The polyp was sessile.       The polyp was removed with a cold snare. Resection and retrieval were       complete.      Two sessile polyps were found in the rectum. The polyps were 4 to 5 mm       in size. These polyps were removed with a cold snare. Resection and       retrieval were complete.      The exam was otherwise without abnormality on direct and retroflexion       views. Impression:            - Non-bleeding internal hemorrhoids.                        - One 8 mm polyp in the ascending colon, removed with                         a cold snare. Resected and retrieved.                        - Two 4 to 5 mm polyps in the rectum, removed with a                         cold snare. Resected and retrieved.                        - The examination was otherwise normal on direct and                         retroflexion views. Recommendation:        - Discharge patient to home (with escort).                        - Resume previous diet.                        - Continue present medications.                        - Await pathology results.                        - Repeat colonoscopy for surveillance based on  pathology results. Procedure Code(s):     --- Professional ---                        [phone], Colonoscopy, flexible; with removal of                         tumor(s), polyp(s), or other lesion(s) by snare                         technique Diagnosis Code(s):     --- Professional ---                        K63.5, Polyp of colon                        K62.1, Rectal polyp                        K64.0, First degree hemorrhoids                        K62.5, Hemorrhage of anus and rectum CPT copyright 2019 American  Medical Association. All rights reserved. The codes documented in this report are preliminary and upon coder review may  be revised to meet current compliance requirements. Jonathon Bellows, MD Jonathon Bellows MD, MD 10/21/2020 8:14:09 AM This report has been signed electronically. Number of Addenda: 0 Note Initiated On: 10/21/2020 7:20 AM Scope Withdrawal Time: 0 hours 13 minutes 31 seconds  Total Procedure Duration: 0 hours 15 minutes 49 seconds  Estimated Blood Loss:  Estimated blood loss: none.      [facility]

## 2020-10-22 ENCOUNTER — Encounter: Payer: Self-pay | Admitting: Gastroenterology

## 2020-10-22 LAB — SURGICAL PATHOLOGY

## 2021-01-14 ENCOUNTER — Ambulatory Visit: Payer: BC Managed Care – PPO | Admitting: Gastroenterology

## 2021-04-29 ENCOUNTER — Emergency Department
Admission: EM | Admit: 2021-04-29 | Discharge: 2021-04-29 | Disposition: A | Discharge status: Home / Self Care | Payer: BC Managed Care – PPO | Attending: Emergency Medicine | Admitting: Emergency Medicine

## 2021-04-29 DIAGNOSIS — Z5321 Procedure and treatment not carried out due to patient leaving prior to being seen by health care provider: Secondary | ICD-10-CM | POA: Insufficient documentation

## 2021-06-23 ENCOUNTER — Other Ambulatory Visit: Payer: Self-pay

## 2021-06-23 ENCOUNTER — Ambulatory Visit
Admission: EM | Admit: 2021-06-23 | Discharge: 2021-06-23 | Disposition: A | Discharge status: Home / Self Care | Payer: BC Managed Care – PPO | Attending: Physician Assistant | Admitting: Physician Assistant

## 2021-06-23 ENCOUNTER — Encounter: Payer: Self-pay | Admitting: Emergency Medicine

## 2021-06-23 DIAGNOSIS — Z20822 Contact with and (suspected) exposure to covid-19: Secondary | ICD-10-CM | POA: Diagnosis not present

## 2021-06-23 DIAGNOSIS — I10 Essential (primary) hypertension: Secondary | ICD-10-CM | POA: Diagnosis not present

## 2021-06-23 DIAGNOSIS — R197 Diarrhea, unspecified: Secondary | ICD-10-CM | POA: Insufficient documentation

## 2021-06-23 DIAGNOSIS — R051 Acute cough: Secondary | ICD-10-CM | POA: Diagnosis not present

## 2021-06-23 DIAGNOSIS — E785 Hyperlipidemia, unspecified: Secondary | ICD-10-CM | POA: Diagnosis not present

## 2021-06-23 DIAGNOSIS — R112 Nausea with vomiting, unspecified: Secondary | ICD-10-CM | POA: Diagnosis not present

## 2021-06-23 DIAGNOSIS — B349 Viral infection, unspecified: Secondary | ICD-10-CM | POA: Diagnosis not present

## 2021-06-23 MED ORDER — CHERATUSSIN AC 100-10 MG/5ML PO SOLN: 10.0000 mL | Freq: Three times a day (TID) | ORAL | 0 refills | Status: AC | PRN

## 2021-06-23 NOTE — ED Triage Notes (Signed)
Pt presents today with c/o n/v/d with cough since last Thursday with low grade fever.

## 2021-06-23 NOTE — ED Provider Notes (Signed)
[facility]    CSN: [id number] Arrival date & time: 06/23/21  1534      History   Chief Complaint Chief Complaint  Patient presents with   Diarrhea   Emesis   Cough    HPI KAYDAN WONG IV is a 50 y.o. male presenting for 5-day history of cough, congestion, nausea/vomiting and diarrhea.  Patient also reports fatigue and low-grade fever.  No breathing difficulty.  Denies any sick contacts.  Has had a negative COVID test at home.  Has been taking over-the-counter medication for his symptoms.  Not taking any antidiarrheal medications.  Reports up to 7 episodes of diarrhea day.  No abdominal pain.  Patient has past medical history significant for hypertension and hyperlipidemia.  HPI  Past Medical History:  Diagnosis Date   GERD (gastroesophageal reflux disease)    Hyperlipemia    Hypertension     Patient Active Problem List   Diagnosis Date Noted   Viral URI with cough 07/09/2017   Multiple allergies 07/09/2017    Past Surgical History:  Procedure Laterality Date   CATARACT EXTRACTION     LASIK         Home Medications    Prior to Admission medications   Medication Sig Start Date End Date Taking? Authorizing Provider  guaiFENesin-codeine (CHERATUSSIN AC) 100-10 MG/5ML syrup Take 10 mLs by mouth 3 (three) times daily as needed for cough. 06/23/21  Yes Shirlee Latch, PA-C  cimetidine (TAGAMET) 200 MG tablet Take 200 mg by mouth 2 (two) times daily.    [provider]  lisinopril (PRINIVIL,ZESTRIL) 20 MG tablet Take 20 mg by mouth daily.    [provider]  lovastatin (MEVACOR) 20 MG tablet Take 20 mg by mouth at bedtime.    [provider]  montelukast (SINGULAIR) 10 MG tablet Take by mouth. 04/15/16   [provider]  omeprazole (PRILOSEC) 20 MG capsule Take by mouth. 08/26/15   [provider]  triamcinolone ointment (KENALOG) 0.5 % Apply 1 application topically 2 (two) times daily. 01/26/19   Tommie Sams, DO  benazepril-hydrochlorthiazide (LOTENSIN HCT) 5-6.25 MG tablet Take by mouth.  01/26/19  [provider]  bisoprolol-hydrochlorothiazide (ZIAC) 2.5-6.25 MG per tablet Take 1 tablet by mouth daily.  01/26/19  [provider]    Family History Family History  Problem Relation Age of Onset   Cancer Mother    Heart disease Father     Social History Social History   Tobacco Use   Smoking status: Former   Smokeless tobacco: Former  Building services engineer Use: Never used  Substance Use Topics   Alcohol use: Yes    Alcohol/week: 0.0 standard drinks    Comment: occasionally   Drug use: No     Allergies   Iodinated diagnostic agents   Review of Systems Review of Systems  Constitutional:  Positive for fatigue. Negative for fever.  HENT:  Positive for congestion. Negative for rhinorrhea, sinus pressure, sinus pain and sore throat.   Respiratory:  Positive for cough. Negative for shortness of breath.   Gastrointestinal:  Positive for diarrhea, nausea and vomiting. Negative for abdominal pain.  Musculoskeletal:  Negative for myalgias.  Neurological:  Negative for weakness, light-headedness and headaches.  Hematological:  Negative for adenopathy.    Physical Exam Triage Vital Signs ED Triage Vitals  Enc Vitals Group     BP 06/23/21 1718 110/72     Pulse Rate 06/23/21 1718 92  Resp 06/23/21 1718 20     Temp 06/23/21 1718 99.1 F (37.3 C)     Temp Source 06/23/21 1718 Oral     SpO2 06/23/21 1718 96 %     Weight --      Height --      Head Circumference --      Peak Flow --      Pain Score 06/23/21 1716 0     Pain Loc --      Pain Edu? --      Excl. in GC? --    No data found.  Updated Vital Signs BP 110/72 (BP Location: Right Arm)   Pulse 92   Temp 99.1 F (37.3 C) (Oral)   Resp 20   SpO2 96%      Physical Exam Vitals and nursing note reviewed.  Constitutional:      General: He is not in acute distress.    Appearance: Normal  appearance. He is well-developed. He is ill-appearing. He is not diaphoretic.  HENT:     Head: Normocephalic and atraumatic.     Nose: Congestion present.     Mouth/Throat:     Mouth: Mucous membranes are moist.     Pharynx: Oropharynx is clear. Uvula midline.     Tonsils: No tonsillar abscesses.  Eyes:     General: No scleral icterus.       Right eye: No discharge.        Left eye: No discharge.     Conjunctiva/sclera: Conjunctivae normal.  Neck:     Thyroid: No thyromegaly.     Trachea: No tracheal deviation.  Cardiovascular:     Rate and Rhythm: Normal rate and regular rhythm.     Heart sounds: Normal heart sounds.  Pulmonary:     Effort: Pulmonary effort is normal. No respiratory distress.     Breath sounds: Normal breath sounds. No wheezing, rhonchi or rales.  Abdominal:     Palpations: Abdomen is soft.     Tenderness: There is no abdominal tenderness.  Musculoskeletal:     Cervical back: Normal range of motion and neck supple.  Lymphadenopathy:     Cervical: No cervical adenopathy.  Skin:    General: Skin is warm and dry.     Findings: No rash.  Neurological:     General: No focal deficit present.     Mental Status: He is alert. Mental status is at baseline.     Motor: No weakness.     Coordination: Coordination normal.     Gait: Gait normal.  Psychiatric:        Mood and Affect: Mood normal.        Behavior: Behavior normal.        Thought Content: Thought content normal.     UC Treatments / Results  Labs (all labs ordered are listed, but only abnormal results are displayed) Labs Reviewed  SARS CORONAVIRUS 2 (TAT 6-24 HRS)    EKG   Radiology No results found.  Procedures Procedures (including critical care time)  Medications Ordered in UC Medications - No data to display  Initial Impression / Assessment and Plan / UC Course  I have reviewed the triage vital signs and the nursing notes.  Pertinent labs & imaging results that were available  during my care of the patient were reviewed by me and considered in my medical decision making (see chart for details).   50 year old male presenting for 5-day history of cough, congestion, nausea/vomiting and fatigue.  Also reports low-grade fever.  Patient says cough is severe and keeps him up at night.  Yesterday over-the-counter cough medication without improvement in symptoms.  Negative COVID test at home.  Vitals are stable.  He is ill-appearing but nontoxic.  Exam seen for nasal congestion.  Chest clear auscultation.  PCR COVID test obtained.  Current CDC guidelines, isolation protocol and ED precautions reviewed with patient.  Advised patient symptoms consistent with a viral illness, suspect possible COVID-19 or flulike illness.  Advise increasing rest and fluids.  Cheratussin sent to pharmacy after reviewing controlled substance database.  Reviewed return and ED precautions patient.   Final Clinical Impressions(s) / UC Diagnoses   Final diagnoses:  Viral illness  Acute cough  Diarrhea, unspecified type     Discharge Instructions      -Your symptoms are consistent with a viral illness.  I have suspicion for possible COVID or other viral illness.  Your COVID test will come back tomorrow.  If you are COVID-positive you need to be isolated 5 days from symptom onset and wear a mask for 5 days. - Is important to increase your fluid intake.  I would start taking Imodium and making sure to drink plenty of Pedialyte or other electrolyte containing fluids.  You do not want to get dehydrated.  However, if you run any high fevers, notice blood in stool or have dark stools you need to stop taking the Imodium and be seen again.  Also if you start to have abdominal pain you need to stop taking it and be seen again. - You should start to feel better over the next week. - If you have any severe acute worsening of your cough no chest pain or breathing trouble you needs to be seen again.     ED  Prescriptions     Medication Sig Dispense Auth. Provider   guaiFENesin-codeine (CHERATUSSIN AC) 100-10 MG/5ML syrup Take 10 mLs by mouth 3 (three) times daily as needed for cough. 118 mL Shirlee Latch, PA-C      PDMP not reviewed this encounter.   Shirlee Latch, PA-C 06/26/21 [phone]

## 2021-06-23 NOTE — Discharge Instructions (Signed)
-  Your symptoms are consistent with a viral illness.  I have suspicion for possible COVID or other viral illness.  Your COVID test will come back tomorrow.  If you are COVID-positive you need to be isolated 5 days from symptom onset and wear a mask for 5 days. - Is important to increase your fluid intake.  I would start taking Imodium and making sure to drink plenty of Pedialyte or other electrolyte containing fluids.  You do not want to get dehydrated.  However, if you run any high fevers, notice blood in stool or have dark stools you need to stop taking the Imodium and be seen again.  Also if you start to have abdominal pain you need to stop taking it and be seen again. - You should start to feel better over the next week. - If you have any severe acute worsening of your cough no chest pain or breathing trouble you needs to be seen again.

## 2021-06-24 LAB — SARS CORONAVIRUS 2 (TAT 6-24 HRS): SARS Coronavirus 2: NEGATIVE

## 2023-01-17 ENCOUNTER — Other Ambulatory Visit: Payer: Self-pay | Admitting: Internal Medicine

## 2023-01-17 DIAGNOSIS — R0789 Other chest pain: Secondary | ICD-10-CM

## 2023-01-17 DIAGNOSIS — R079 Chest pain, unspecified: Secondary | ICD-10-CM

## 2023-01-31 ENCOUNTER — Ambulatory Visit
Admission: RE | Admit: 2023-01-31 | Discharge: 2023-01-31 | Disposition: A | Discharge status: Home / Self Care | Payer: BC Managed Care – PPO | Source: Ambulatory Visit | Attending: Internal Medicine | Admitting: Internal Medicine

## 2023-01-31 DIAGNOSIS — R0789 Other chest pain: Secondary | ICD-10-CM | POA: Insufficient documentation

## 2023-01-31 DIAGNOSIS — R079 Chest pain, unspecified: Secondary | ICD-10-CM | POA: Insufficient documentation

## 2023-10-06 ENCOUNTER — Ambulatory Visit: Payer: Self-pay | Admitting: General Surgery

## 2023-10-07 ENCOUNTER — Ambulatory Visit: Payer: Self-pay | Admitting: General Surgery

## 2023-10-07 ENCOUNTER — Telehealth: Payer: Self-pay | Admitting: General Surgery

## 2023-10-07 ENCOUNTER — Ambulatory Visit: Admitting: General Surgery

## 2023-10-07 ENCOUNTER — Encounter: Payer: Self-pay | Admitting: General Surgery

## 2023-10-07 VITALS — BP 119/77 | HR 89 | Temp 98.0°F | Ht 67.5 in | Wt 189.0 lb

## 2023-10-07 DIAGNOSIS — K4091 Unilateral inguinal hernia, without obstruction or gangrene, recurrent: Secondary | ICD-10-CM

## 2023-10-07 NOTE — Telephone Encounter (Signed)
 Patient has been advised of Pre-Admission date/time, and Surgery date at [facility].  Surgery Date: 10/12/23 Preadmission Testing Date: 10/11/23 (phone 8a-1p)  Patient has been made aware to call [phone], between 1-3:00pm the day before surgery, to find out what time to arrive for surgery.

## 2023-10-07 NOTE — Progress Notes (Signed)
 Patient ID: Barnet Glasgow III, male   DOB: February 02, 1971, 53 y.o.   MRN: [id number] CC: Right Inguinal Hernia History of Present Illness Trequan Marsolek III is a 53 y.o. male with past surgical history significant for open right inguinal hernia who presents in consultation for right inguinal hernia the patient reports that several weeks ago he.  Noted a bulge in his right groin.  Since then he has had pain in his right groin.  He describes the pain is radiating down to the scrotum.  He denies any overlying skin changes, nausea or vomiting.  He reports that when he lays down the bulge will go up again.  He says that he had a right inguinal hernia repair several years ago and they did that open and believes that they did use mesh.  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  Past Medical History Past Medical History:  Diagnosis Date   Allergic rhinitis due to pollen 04/01/2015   Complication of anesthesia    hard time waking up when I was 53 years old, carpal tunnel surgery   Dyslipidemia 04/22/2015   Essential hypertension, benign 04/01/2015   Hyperglycemia 04/16/2016      Past Surgical History:  Procedure Laterality Date   COLONOSCOPY WITH PROPOFOL N/A 10/21/2020   Procedure: COLONOSCOPY WITH PROPOFOL;  Surgeon: Wyline Mood, MD;  Location: [facility] ENDOSCOPY;  Service: Gastroenterology;  Laterality: N/A;   EYE SURGERY     HERNIA REPAIR     multiple   NM RENAL LASIX ([facility] HX)     REFRACTIVE SURGERY     TOE SURGERY Bilateral    big toe   WRIST SURGERY Bilateral    carpal tunnel    Allergies  Allergen Reactions   Codeine Nausea And Vomiting    Current Outpatient Medications  Medication Sig Dispense Refill   atorvastatin (LIPITOR) 40 MG tablet Take 1 tablet (40 mg total) by mouth at bedtime. 90 tablet 3   chlorthalidone (HYGROTON) 50 MG tablet Take 50 mg by mouth daily.      fluticasone (FLONASE) 50 MCG/ACT nasal spray Place 2 sprays into both nostrils daily as  needed. 48 g 3   loratadine (CLARITIN) 10 MG tablet Take 10 mg by mouth daily as needed.      losartan (COZAAR) 50 MG tablet Take 50 mg by mouth daily.     omeprazole (PRILOSEC) 20 MG capsule Take 1 capsule (20 mg total) by mouth daily. 90 capsule 0   No current facility-administered medications for this visit.    Family History Family History  Problem Relation Age of Onset   Hypertension Mother    Cancer Mother        cervical   Heart disease Father    Heart attack Father    Hypertension Brother    Hyperlipidemia Brother    Cancer Maternal Grandmother        lymph nodes, spread to lungs       Social History Social History   Tobacco Use   Smoking status: Former    Current packs/day: 0.00    Average packs/day: 0.5 packs/day for 10.0 years (5.0 ttl pk-yrs)    Types: Cigarettes    Start date: 08/02/2004    Quit date: 08/02/2014    Years since quitting: 9.1    Passive exposure: Past   Smokeless tobacco: Never  Vaping Use   Vaping status: Never Used  Substance Use Topics   Alcohol use: No   Drug  use: No        ROS Full ROS of systems performed and is otherwise negative there than what is stated in the HPI  Physical Exam Blood pressure 119/77, pulse 89, temperature 98 F (36.7 C), height 5' 7.5" (1.715 m), weight 189 lb (85.7 kg), SpO2 98%.  Alert and oriented x 3, clear to auscultation bilaterally, regular rate and rhythm, abdomen soft, obese no umbilical hernia, nontender throughout.  Groin examination performed in the presence of a chaperone.  On his right groin there is a with Valsalva.  When I have him lay down there is no bulge noticeable.  No appreciable large hernia on the left side. On the right groin there is a well-healed surgical scar Data Reviewed Outside records reviewed from his primary care doctor who saw him for a right inguinal bulge and referred him to our office for evaluation.  I have personally reviewed the patient's imaging and medical records.     Assessment    Patient with right inguinal hernia, recurrent  Plan    Discussed risk, benefits alternatives of inguinal hernia repair including risk of infection, bleeding, injury to the vas deferens, testicular ischemia, chronic pain, need for open procedure and recurrence.  I also discussed that we would do this minimally invasive using robotic platform.  We will plan to replace the mesh in the hernia defect.  I discussed with him that given his previous surgery that always increases the risk of complications and a reoperative field.  He understands these risks and wishes to proceed.  For robotic assisted right inguinal hernia    Kandis Cocking 10/07/2023, 12:26 PM

## 2023-10-07 NOTE — Patient Instructions (Signed)
 You have chose to have your hernia repaired. This will be done by Dr. Maurine Minister at [facility].  Please see your (blue) Pre-care information that you have been given today. Our surgery scheduler will call you to verify surgery date and to go over information.   You will need to arrange to be out of work for approximately 1-2 weeks and then you may return with a lifting restriction for 4 more weeks. If you have FMLA or Disability paperwork that needs to be filled out, please have your company fax your paperwork to [phone] or you may drop this by either office. This paperwork will be filled out within 3 days after your surgery has been completed.  You may have a bruise in your groin and also swelling and brusing in your testicle area. You may use ice 4-5 times daily for 15-20 minutes each time. Make sure that you place a barrier between you and the ice pack. To decrease the swelling, you may roll up a bath towel and place it vertically in between your thighs with your testicles resting on the towel. You will want to keep this area elevated as much as possible for several days following surgery.    Inguinal Hernia, Adult Muscles help keep everything in the body in its proper place. But if a weak spot in the muscles develops, something can poke through. That is called a hernia. When this happens in the lower part of the belly (abdomen), it is called an inguinal hernia. (It takes its name from a part of the body in this region called the inguinal canal.) A weak spot in the wall of muscles lets some fat or part of the small intestine bulge through. An inguinal hernia can develop at any age. Men get them more often than women. CAUSES  In adults, an inguinal hernia develops over time. It can be triggered by: Suddenly straining the muscles of the lower abdomen. Lifting heavy objects. Straining to have a bowel movement. Difficult bowel movements (constipation) can lead to this. Constant coughing. This  may be caused by smoking or lung disease. Being overweight. Being pregnant. Working at a job that requires long periods of standing or heavy lifting. Having had an inguinal hernia before. One type can be an emergency situation. It is called a strangulated inguinal hernia. It develops if part of the small intestine slips through the weak spot and cannot get back into the abdomen. The blood supply can be cut off. If that happens, part of the intestine may die. This situation requires emergency surgery. SYMPTOMS  Often, a small inguinal hernia has no symptoms. It is found when a healthcare provider does a physical exam. Larger hernias usually have symptoms.  In adults, symptoms may include: A lump in the groin. This is easier to see when the person is standing. It might disappear when lying down. In men, a lump in the scrotum. Pain or burning in the groin. This occurs especially when lifting, straining or coughing. A dull ache or feeling of pressure in the groin. Signs of a strangulated hernia can include: A bulge in the groin that becomes very painful and tender to the touch. A bulge that turns red or purple. Fever, nausea and vomiting. Inability to have a bowel movement or to pass gas. DIAGNOSIS  To decide if you have an inguinal hernia, a healthcare provider will probably do a physical examination. This will include asking questions about any symptoms you have noticed. The healthcare provider might  feel the groin area and ask you to cough. If an inguinal hernia is felt, the healthcare provider may try to slide it back into the abdomen. Usually no other tests are needed. TREATMENT  Treatments can vary. The size of the hernia makes a difference. Options include: Watchful waiting. This is often suggested if the hernia is small and you have had no symptoms. No medical procedure will be done unless symptoms develop. You will need to watch closely for symptoms. If any occur, contact your  healthcare provider right away. Surgery. This is used if the hernia is larger or you have symptoms. Open surgery. This is usually an outpatient procedure (you will not stay overnight in a hospital). An cut (incision) is made through the skin in the groin. The hernia is put back inside the abdomen. The weak area in the muscles is then repaired by herniorrhaphy or hernioplasty. Herniorrhaphy: in this type of surgery, the weak muscles are sewn back together. Hernioplasty: a patch or mesh is used to close the weak area in the abdominal wall. Laparoscopy. In this procedure, a surgeon makes small incisions. A thin tube with a tiny video camera (called a laparoscope) is put into the abdomen. The surgeon repairs the hernia with mesh by looking with the video camera and using two long instruments. HOME CARE INSTRUCTIONS  After surgery to repair an inguinal hernia: You will need to take pain medicine prescribed by your healthcare provider. Follow all directions carefully. You will need to take care of the wound from the incision. Your activity will be restricted for awhile. This will probably include no heavy lifting for several weeks. You also should not do anything too active for a few weeks. When you can return to work will depend on the type of job that you have. During "watchful waiting" periods, you should: Maintain a healthy weight. Eat a diet high in fiber (fruits, vegetables and whole grains). Drink plenty of fluids to avoid constipation. This means drinking enough water and other liquids to keep your urine clear or pale yellow. Do not lift heavy objects. Do not stand for long periods of time. Quit smoking. This should keep you from developing a frequent cough. SEEK MEDICAL CARE IF:  A bulge develops in your groin area. You feel pain, a burning sensation or pressure in the groin. This might be worse if you are lifting or straining. You develop a fever of more than 100.5 F (38.1 C). SEEK  IMMEDIATE MEDICAL CARE IF:  Pain in the groin increases suddenly. A bulge in the groin gets bigger suddenly and does not go down. For men, there is sudden pain in the scrotum. Or, the size of the scrotum increases. A bulge in the groin area becomes red or purple and is painful to touch. You have nausea or vomiting that does not go away. You feel your heart beating much faster than normal. You cannot have a bowel movement or pass gas. You develop a fever of more than 102.0 F (38.9 C).   This information is not intended to replace advice given to you by your health care provider. Make sure you discuss any questions you have with your health care provider.   Document Released: 12/05/2008 Document Revised: 10/11/2011 Document Reviewed: 01/20/2015 Elsevier Interactive Patient Education [employer]

## 2023-10-10 ENCOUNTER — Ambulatory Visit: Admitting: General Surgery

## 2023-10-10 ENCOUNTER — Ambulatory Visit: Admitting: Surgery

## 2023-10-11 ENCOUNTER — Encounter
Admission: RE | Admit: 2023-10-11 | Discharge: 2023-10-11 | Disposition: A | Discharge status: Home / Self Care | Source: Ambulatory Visit | Attending: General Surgery | Admitting: General Surgery

## 2023-10-11 ENCOUNTER — Other Ambulatory Visit: Payer: Self-pay | Admitting: General Surgery

## 2023-10-11 ENCOUNTER — Other Ambulatory Visit: Payer: Self-pay

## 2023-10-11 ENCOUNTER — Ambulatory Visit: Admitting: General Surgery

## 2023-10-11 VITALS — Ht 68.0 in | Wt 188.0 lb

## 2023-10-11 DIAGNOSIS — Z01812 Encounter for preprocedural laboratory examination: Secondary | ICD-10-CM

## 2023-10-11 DIAGNOSIS — R739 Hyperglycemia, unspecified: Secondary | ICD-10-CM

## 2023-10-11 DIAGNOSIS — E876 Hypokalemia: Secondary | ICD-10-CM | POA: Insufficient documentation

## 2023-10-11 DIAGNOSIS — Z79899 Other long term (current) drug therapy: Secondary | ICD-10-CM | POA: Insufficient documentation

## 2023-10-11 DIAGNOSIS — K4090 Unilateral inguinal hernia, without obstruction or gangrene, not specified as recurrent: Secondary | ICD-10-CM | POA: Insufficient documentation

## 2023-10-11 DIAGNOSIS — I10 Essential (primary) hypertension: Secondary | ICD-10-CM | POA: Insufficient documentation

## 2023-10-11 DIAGNOSIS — Z01818 Encounter for other preprocedural examination: Secondary | ICD-10-CM | POA: Diagnosis present

## 2023-10-11 HISTORY — DX: Gastro-esophageal reflux disease without esophagitis: K21.9

## 2023-10-11 LAB — CBC
HCT: 41.1 % (ref 39.0–52.0)
Hemoglobin: 14.9 g/dL (ref 13.0–17.0)
MCH: 30.7 pg (ref 26.0–34.0)
MCHC: 36.3 g/dL — ABNORMAL HIGH (ref 30.0–36.0)
MCV: 84.6 fL (ref 80.0–100.0)
Platelets: 472 10*3/uL — ABNORMAL HIGH (ref 150–400)
RBC: 4.86 MIL/uL (ref 4.22–5.81)
RDW: 12.8 % (ref 11.5–15.5)
WBC: 8.7 10*3/uL (ref 4.0–10.5)
nRBC: 0 % (ref 0.0–0.2)

## 2023-10-11 LAB — BASIC METABOLIC PANEL
Anion gap: 11 (ref 5–15)
BUN: 18 mg/dL (ref 6–20)
CO2: 26 mmol/L (ref 22–32)
Calcium: 9.6 mg/dL (ref 8.9–10.3)
Chloride: 98 mmol/L (ref 98–111)
Creatinine, Ser: 0.99 mg/dL (ref 0.61–1.24)
GFR, Estimated: >60 mL/min (ref >60)
Glucose, Bld: 100 mg/dL — ABNORMAL HIGH (ref 70–99)
Potassium: 2.4 mmol/L — CL (ref 3.5–5.1)
Sodium: 135 mmol/L (ref 135–145)

## 2023-10-11 MED ORDER — ORAL CARE MOUTH RINSE: 15.0000 mL | Freq: Once | OROMUCOSAL | Status: AC

## 2023-10-11 MED ORDER — POTASSIUM CHLORIDE CRYS ER 20 MEQ PO TBCR: 60.0000 meq | EXTENDED_RELEASE_TABLET | Freq: Two times a day (BID) | ORAL | 0 refills | Status: DC

## 2023-10-11 MED ORDER — CEFAZOLIN SODIUM-DEXTROSE 2-4 GM/100ML-% IV SOLN
2.0000 g | INTRAVENOUS | Status: AC
  Administered 2023-10-12: 2 g via INTRAVENOUS

## 2023-10-11 MED ORDER — CHLORHEXIDINE GLUCONATE CLOTH 2 % EX PADS: 6.0000 | MEDICATED_PAD | Freq: Once | CUTANEOUS | Status: DC

## 2023-10-11 MED ORDER — CHLORHEXIDINE GLUCONATE 0.12 % MT SOLN
15.0000 mL | Freq: Once | OROMUCOSAL | Status: AC
  Administered 2023-10-12: 15 mL via OROMUCOSAL

## 2023-10-11 MED ORDER — LACTATED RINGERS IV SOLN: INTRAVENOUS | Status: DC

## 2023-10-11 NOTE — Progress Notes (Signed)
  [facility] Perioperative Services: Pre-Admission/Anesthesia Testing  Abnormal Lab Notification and Treatment Plan of Care   Date: 10/11/23  Name: Andrew Bond MRN:   [id number]  Re: Abnormal labs noted during PAT appointment   Notified:  Provider Name Provider Role Notification Mode  Baker Pierini, MD  General Surgery (Surgeon) Routed and/or faxed via Wille Glaser, FNP Primary Care Provider Routed and/or faxed via [facility]   Clinical Information and Notes:  ABNORMAL LAB VALUE(S): Lab Results  Component Value Date   K 2.4 (LL) 10/11/2023   Andrew Bond is scheduled for an elective REPAIR, HERNIA, INGUINAL, ROBOT-ASSISTED, LAPAROSCOPIC, USING MESH (Right) REPAIR, HERNIA, INGUINAL, ADULT (Right) on 10/12/2023. In review of his medication reconciliation, it is noted that the patient is taking prescribed diuretic medications (chlorthalidone 50 mg) daily.   Please note, in efforts to promote a safe and effective anesthetic course, per current guidelines/standards set by the [facility] anesthesia team, the minimal acceptable K+ level for the patient to proceed with general anesthesia is 3.0 mmol/L. With that being said, if the patient drops any lower, his elective procedure will need to be postponed until K+ is better optimized. In efforts to prevent case cancellation, and ultimately to promote the safety of this patient undergoing sedation/anesthesia, communicated with attending surgeon to discuss. Will make efforts to optimize pre-surgical K+ level allowing the surgical intervention to proceed as planned.    Impression and Plan:  Avir Deruiter [facility] Bond found to be HYPOkalemic at 2.4 mmol/L on preoperative labs.   He is on thiazide-like diuretic therapy. Patient does not take oral K+ supplement. Diuretic therapy is the likely etiology in the absence of GI related symptoms (no diarrhea). Patient denies regular use of laxative medications.  Dr. Maurine Minister, MD to contact patient to discuss plans for optimization, as follows:  Oral PhosNaK tonight; Rx to be sent in by Dr. Maurine Minister.  Recheck K+ tomorrow as soon as patient arrives. Order for K+ via [employer] has been entered.   +/- IV KCl- per surgery/anesthesia to get to acceptable level for planned surgical procedure.   Will send copy of this note to surgeon and PCP to make them aware of K+ level and plans for correction. PCP may elect to pursue a change in diuretic therapy to a K+ sparing type medication, or alternatively, they may consider adding a daily K+ supplement if levels remain low on recheck. Order entered to recheck K+ on the day of his surgery to ensure optimization.   SDS charge nurse Melvyn Neth, RN) made aware of plans as outlined above. No further needs from the PAT department at this time.   Quentin Mulling, MSN, APRN, FNP-C, CEN [facility]  Perioperative Services Nurse Practitioner Phone: [phone] 10/11/23 3:30 PM  NOTE: This note has been prepared using Dragon dictation software. Despite my best ability to proofread, there is always the potential that unintentional transcriptional errors may still occur from this process.

## 2023-10-11 NOTE — Patient Instructions (Addendum)
 Your procedure is scheduled on: Wednesday 10/12/23  Report to the Registration Desk on the 1st floor of the [facility]. To find out your arrival time, please call [phone] between 1PM - 3PM on: Tuesday 10/11/23  If your arrival time is 6:00 am, do not arrive before that time as the [facility] entrance doors do not open until 6:00 am.  REMEMBER: Instructions that are not followed completely may result in serious medical risk, up to and including death; or upon the discretion of your surgeon and anesthesiologist your surgery may need to be rescheduled.  Do not eat food or drink fluids after midnight the night before surgery.  No gum chewing or hard candies.   One week prior to surgery: Stop Anti-inflammatories (NSAIDS) such as Advil, Aleve, Ibuprofen, Motrin, Naproxen, Naprosyn and Aspirin based products such as Excedrin, Goody's Powder, BC Powder. Stop ANY OVER THE COUNTER supplements until after surgery.  You may however, continue to take Tylenol if needed for pain up until the day of surgery.   Continue taking all of your other prescription medications up until the day of surgery.  ON THE DAY OF SURGERY ONLY TAKE THESE MEDICATIONS WITH SIPS OF WATER:  omeprazole (PRILOSEC) 20 MG   Use inhalers on the day of surgery and bring to the hospital.   No Alcohol for 24 hours before or after surgery.  No Smoking including e-cigarettes for 24 hours before surgery.  No chewable tobacco products for at least 6 hours before surgery.  No nicotine patches on the day of surgery.  Do not use any "recreational" drugs for at least a week (preferably 2 weeks) before your surgery.  Please be advised that the combination of cocaine and anesthesia may have negative outcomes, up to and including death. If you test positive for cocaine, your surgery will be cancelled.  On the morning of surgery brush your teeth with toothpaste and water, you may rinse your mouth with mouthwash if you wish. Do  not swallow any toothpaste or mouthwash.  Use CHG Soap or wipes as directed on instruction sheet.  Do not wear jewelry, make-up, hairpins, clips or nail polish.  For welded (permanent) jewelry: bracelets, anklets, waist bands, etc.  Please have this removed prior to surgery.  If it is not removed, there is a chance that hospital personnel will need to cut it off on the day of surgery.  Do not wear lotions, powders, or perfumes.   Do not shave body hair from the neck down 48 hours before surgery.  Contact lenses, hearing aids and dentures may not be worn into surgery.  Do not bring valuables to the hospital. [facility] is not responsible for any missing/lost belongings or valuables.   Notify your doctor if there is any change in your medical condition (cold, fever, infection).  Wear comfortable clothing (specific to your surgery type) to the hospital.  After surgery, you can help prevent lung complications by doing breathing exercises.  Take deep breaths and cough every 1-2 hours. Your doctor may order a device called an Incentive Spirometer to help you take deep breaths. When coughing or sneezing, hold a pillow firmly against your incision with both hands. This is called "splinting." Doing this helps protect your incision. It also decreases belly discomfort.  If you are being admitted to the hospital overnight, leave your suitcase in the car. After surgery it may be brought to your room.  In case of increased patient census, it may be necessary for you,  the patient, to continue your postoperative care in the Same Day Surgery department.  If you are being discharged the day of surgery, you will not be allowed to drive home. You will need a responsible individual to drive you home and stay with you for 24 hours after surgery.   If you are taking public transportation, you will need to have a responsible individual with you.  Please call the Pre-admissions Testing Dept. at [phone] if you have any questions about these instructions.  Surgery Visitation Policy:  Patients having surgery or a procedure may have two visitors.  Children under the age of 6 must have an adult with them who is not the patient.  Temporary Visitor Restrictions Due to increasing cases of flu, RSV and COVID-19: Children ages 38 and under will not be able to visit patients in [facility] hospitals under most circumstances.  Inpatient Visitation:    Visiting hours are 7 a.m. to 8 p.m. Up to four visitors are allowed at one time in a patient room. The visitors may rotate out with other people during the day.  One visitor age 108 or older may stay with the patient overnight and must be in the room by 8 p.m.     Preparing for Surgery with CHLORHEXIDINE GLUCONATE (CHG) Soap  Chlorhexidine Gluconate (CHG) Soap  o An antiseptic cleaner that kills germs and bonds with the skin to continue killing germs even after washing  o Used for showering the night before surgery and morning of surgery  Before surgery, you can play an important role by reducing the number of germs on your skin.  CHG (Chlorhexidine gluconate) soap is an antiseptic cleanser which kills germs and bonds with the skin to continue killing germs even after washing.  Please do not use if you have an allergy to CHG or antibacterial soaps. If your skin becomes reddened/irritated stop using the CHG.  1. Shower the NIGHT BEFORE SURGERY and the MORNING OF SURGERY with CHG soap.  2. If you choose to wash your hair, wash your hair first as usual with your normal shampoo.  3. After shampooing, rinse your hair and body thoroughly to remove the shampoo.  4. Use CHG as you would any other liquid soap. You can apply CHG directly to the skin and wash gently with a scrungie or a clean washcloth.  5. Apply the CHG soap to your body only from the neck down. Do not use on open wounds or open sores. Avoid contact with your eyes, ears,  mouth, and genitals (private parts). Wash face and genitals (private parts) with your normal soap.  6. Wash thoroughly, paying special attention to the area where your surgery will be performed.  7. Thoroughly rinse your body with warm water.  8. Do not shower/wash with your normal soap after using and rinsing off the CHG soap.  9. Pat yourself dry with a clean towel.  10. Wear clean pajamas to bed the night before surgery.  12. Place clean sheets on your bed the night of your first shower and do not sleep with pets.  13. Shower again with the CHG soap on the day of surgery prior to arriving at the hospital.  14. Do not apply any deodorants/lotions/powders.  15. Please wear clean clothes to the hospital.

## 2023-10-12 ENCOUNTER — Ambulatory Visit: Payer: Self-pay | Admitting: Anesthesiology

## 2023-10-12 ENCOUNTER — Ambulatory Visit
Admission: RE | Admit: 2023-10-12 | Discharge: 2023-10-12 | Disposition: A | Discharge status: Home / Self Care | Attending: General Surgery | Admitting: General Surgery

## 2023-10-12 ENCOUNTER — Encounter: Payer: Self-pay | Admitting: General Surgery

## 2023-10-12 ENCOUNTER — Ambulatory Visit: Payer: Self-pay | Admitting: Urgent Care

## 2023-10-12 ENCOUNTER — Other Ambulatory Visit: Payer: Self-pay

## 2023-10-12 ENCOUNTER — Encounter
Admission: RE | Disposition: A | Discharge status: Home / Self Care | Payer: Self-pay | Source: Home / Self Care | Attending: General Surgery

## 2023-10-12 DIAGNOSIS — T502X5A Adverse effect of carbonic-anhydrase inhibitors, benzothiadiazides and other diuretics, initial encounter: Secondary | ICD-10-CM

## 2023-10-12 DIAGNOSIS — K219 Gastro-esophageal reflux disease without esophagitis: Secondary | ICD-10-CM | POA: Insufficient documentation

## 2023-10-12 DIAGNOSIS — Z8249 Family history of ischemic heart disease and other diseases of the circulatory system: Secondary | ICD-10-CM | POA: Diagnosis not present

## 2023-10-12 DIAGNOSIS — Z87891 Personal history of nicotine dependence: Secondary | ICD-10-CM | POA: Diagnosis not present

## 2023-10-12 DIAGNOSIS — Z01812 Encounter for preprocedural laboratory examination: Secondary | ICD-10-CM

## 2023-10-12 DIAGNOSIS — I10 Essential (primary) hypertension: Secondary | ICD-10-CM | POA: Insufficient documentation

## 2023-10-12 DIAGNOSIS — Z79899 Other long term (current) drug therapy: Secondary | ICD-10-CM

## 2023-10-12 DIAGNOSIS — K4091 Unilateral inguinal hernia, without obstruction or gangrene, recurrent: Secondary | ICD-10-CM | POA: Diagnosis present

## 2023-10-12 HISTORY — PX: XI ROBOTIC ASSISTED INGUINAL HERNIA REPAIR WITH MESH: SHX6706

## 2023-10-12 HISTORY — PX: INGUINAL HERNIA REPAIR: SHX194

## 2023-10-12 LAB — POCT I-STAT, CHEM 8
BUN: 17 mg/dL (ref 6–20)
Calcium, Ion: 1.14 mmol/L — ABNORMAL LOW (ref 1.15–1.40)
Chloride: 102 mmol/L (ref 98–111)
Creatinine, Ser: 0.9 mg/dL (ref 0.61–1.24)
Glucose, Bld: 105 mg/dL — ABNORMAL HIGH (ref 70–99)
HCT: 43 % (ref 39.0–52.0)
Hemoglobin: 14.6 g/dL (ref 13.0–17.0)
Potassium: 3.3 mmol/L — ABNORMAL LOW (ref 3.5–5.1)
Sodium: 137 mmol/L (ref 135–145)
TCO2: 23 mmol/L (ref 22–32)

## 2023-10-12 SURGERY — REPAIR, HERNIA, INGUINAL, ROBOT-ASSISTED, LAPAROSCOPIC, USING MESH
Anesthesia: General | Laterality: Right

## 2023-10-12 MED ORDER — MIDAZOLAM HCL 2 MG/2ML IJ SOLN
INTRAMUSCULAR | Status: DC | PRN
  Administered 2023-10-12: 2 mg via INTRAVENOUS

## 2023-10-12 MED ORDER — BUPIVACAINE-EPINEPHRINE (PF) 0.5% -1:200000 IJ SOLN
INTRAMUSCULAR | Status: DC | PRN
  Administered 2023-10-12: 20 mL

## 2023-10-12 MED ORDER — DEXAMETHASONE SODIUM PHOSPHATE 10 MG/ML IJ SOLN
INTRAMUSCULAR | Status: DC | PRN
  Administered 2023-10-12: 10 mg via INTRAVENOUS

## 2023-10-12 MED ORDER — BUPIVACAINE LIPOSOME 1.3 % IJ SUSP
INTRAMUSCULAR | Status: AC
  Filled 2023-10-12: qty 10

## 2023-10-12 MED ORDER — CALCIUM CHLORIDE 10 % IV SOLN
INTRAVENOUS | Status: DC | PRN
  Administered 2023-10-12 (×2): .5 g via INTRAVENOUS

## 2023-10-12 MED ORDER — PROPOFOL 10 MG/ML IV BOLUS
INTRAVENOUS | Status: DC | PRN
  Administered 2023-10-12: 50 mg via INTRAVENOUS
  Administered 2023-10-12: 100 mg via INTRAVENOUS
  Administered 2023-10-12: 150 mg via INTRAVENOUS

## 2023-10-12 MED ORDER — FENTANYL CITRATE (PF) 100 MCG/2ML IJ SOLN
INTRAMUSCULAR | Status: AC
Start: 2023-10-12 — End: ?
  Filled 2023-10-12: qty 2

## 2023-10-12 MED ORDER — ROCURONIUM BROMIDE 100 MG/10ML IV SOLN
INTRAVENOUS | Status: DC | PRN
  Administered 2023-10-12: 30 mg via INTRAVENOUS
  Administered 2023-10-12: 20 mg via INTRAVENOUS
  Administered 2023-10-12: 30 mg via INTRAVENOUS
  Administered 2023-10-12: 20 mg via INTRAVENOUS

## 2023-10-12 MED ORDER — OXYCODONE HCL 5 MG PO TABS
ORAL_TABLET | ORAL | Status: AC
  Filled 2023-10-12: qty 1

## 2023-10-12 MED ORDER — FENTANYL CITRATE (PF) 100 MCG/2ML IJ SOLN
25.0000 ug | INTRAMUSCULAR | Status: DC | PRN
  Administered 2023-10-12 (×3): 25 ug via INTRAVENOUS
  Administered 2023-10-12: 50 ug via INTRAVENOUS

## 2023-10-12 MED ORDER — MIDAZOLAM HCL 2 MG/2ML IJ SOLN
INTRAMUSCULAR | Status: AC
  Filled 2023-10-12: qty 2

## 2023-10-12 MED ORDER — OXYCODONE HCL 5 MG PO TABS: 5.0000 mg | ORAL_TABLET | Freq: Three times a day (TID) | ORAL | 0 refills | Status: DC | PRN

## 2023-10-12 MED ORDER — GLYCOPYRROLATE 0.2 MG/ML IJ SOLN
INTRAMUSCULAR | Status: DC | PRN
  Administered 2023-10-12: .2 mg via INTRAVENOUS

## 2023-10-12 MED ORDER — SUCCINYLCHOLINE CHLORIDE 200 MG/10ML IV SOSY
PREFILLED_SYRINGE | INTRAVENOUS | Status: DC | PRN
  Administered 2023-10-12: 60 mg via INTRAVENOUS

## 2023-10-12 MED ORDER — CHLORHEXIDINE GLUCONATE 0.12 % MT SOLN
OROMUCOSAL | Status: AC
  Filled 2023-10-12: qty 15

## 2023-10-12 MED ORDER — LIDOCAINE HCL URETHRAL/MUCOSAL 2 % EX GEL
CUTANEOUS | Status: DC | PRN
  Administered 2023-10-12: .5 via TOPICAL

## 2023-10-12 MED ORDER — FENTANYL CITRATE (PF) 100 MCG/2ML IJ SOLN
INTRAMUSCULAR | Status: DC | PRN
  Administered 2023-10-12 (×3): 50 ug via INTRAVENOUS

## 2023-10-12 MED ORDER — FENTANYL CITRATE (PF) 100 MCG/2ML IJ SOLN
INTRAMUSCULAR | Status: AC
  Filled 2023-10-12: qty 2

## 2023-10-12 MED ORDER — ACETAMINOPHEN 10 MG/ML IV SOLN
INTRAVENOUS | Status: AC
  Filled 2023-10-12: qty 100

## 2023-10-12 MED ORDER — LIDOCAINE HCL (CARDIAC) PF 100 MG/5ML IV SOSY
PREFILLED_SYRINGE | INTRAVENOUS | Status: DC | PRN
  Administered 2023-10-12: 100 mg via INTRAVENOUS
  Administered 2023-10-12: 60 mg via INTRAVENOUS

## 2023-10-12 MED ORDER — DROPERIDOL 2.5 MG/ML IJ SOLN: 0.6250 mg | Freq: Once | INTRAMUSCULAR | Status: DC | PRN

## 2023-10-12 MED ORDER — PROPOFOL 1000 MG/100ML IV EMUL
INTRAVENOUS | Status: AC
  Filled 2023-10-12: qty 100

## 2023-10-12 MED ORDER — 0.9 % SODIUM CHLORIDE (POUR BTL) OPTIME
TOPICAL | Status: DC | PRN
  Administered 2023-10-12: 500 mL

## 2023-10-12 MED ORDER — OXYCODONE HCL 5 MG PO TABS
5.0000 mg | ORAL_TABLET | Freq: Once | ORAL | Status: AC
  Administered 2023-10-12: 5 mg via ORAL

## 2023-10-12 MED ORDER — SUGAMMADEX SODIUM 200 MG/2ML IV SOLN
INTRAVENOUS | Status: DC | PRN
  Administered 2023-10-12: 200 mg via INTRAVENOUS

## 2023-10-12 MED ORDER — CALCIUM CHLORIDE 10 % IV SOLN
INTRAVENOUS | Status: AC
  Filled 2023-10-12: qty 10

## 2023-10-12 MED ORDER — BUPIVACAINE LIPOSOME 1.3 % IJ SUSP
INTRAMUSCULAR | Status: DC | PRN
  Administered 2023-10-12: 20 mL

## 2023-10-12 MED ORDER — CEFAZOLIN SODIUM-DEXTROSE 2-4 GM/100ML-% IV SOLN
INTRAVENOUS | Status: AC
  Filled 2023-10-12: qty 100

## 2023-10-12 MED ORDER — ACETAMINOPHEN 10 MG/ML IV SOLN
INTRAVENOUS | Status: DC | PRN
  Administered 2023-10-12: 1000 mg via INTRAVENOUS

## 2023-10-12 MED ORDER — BUPIVACAINE-EPINEPHRINE (PF) 0.5% -1:200000 IJ SOLN
INTRAMUSCULAR | Status: AC
  Filled 2023-10-12: qty 30

## 2023-10-12 MED ORDER — PHENYLEPHRINE 80 MCG/ML (10ML) SYRINGE FOR IV PUSH (FOR BLOOD PRESSURE SUPPORT)
PREFILLED_SYRINGE | INTRAVENOUS | Status: DC | PRN
  Administered 2023-10-12: 160 ug via INTRAVENOUS

## 2023-10-12 MED ORDER — ONDANSETRON HCL 4 MG/2ML IJ SOLN
INTRAMUSCULAR | Status: DC | PRN
  Administered 2023-10-12: 4 mg via INTRAVENOUS

## 2023-10-12 SURGICAL SUPPLY — 55 items
BAG PRESSURE INF REUSE 1000 (BAG) IMPLANT
BLADE CLIPPER SURG (BLADE) ×1 IMPLANT
BLADE SURG 15 STRL LF DISP TIS (BLADE) ×1 IMPLANT
BRUSH SCRUB EZ 4% CHG (MISCELLANEOUS) ×1 IMPLANT
CHLORAPREP W/TINT 26 (MISCELLANEOUS) IMPLANT
COVER TIP SHEARS 8 DVNC (MISCELLANEOUS) ×1 IMPLANT
COVER WAND RF STERILE (DRAPES) ×1 IMPLANT
DERMABOND ADVANCED .7 DNX12 (GAUZE/BANDAGES/DRESSINGS) ×1 IMPLANT
DRAIN PENROSE 12X.25 LTX STRL (MISCELLANEOUS) ×1 IMPLANT
DRAPE ARM DVNC X/XI (DISPOSABLE) ×3 IMPLANT
DRAPE COLUMN DVNC XI (DISPOSABLE) ×1 IMPLANT
DRAPE LAPAROTOMY 100X77 ABD (DRAPES) ×1 IMPLANT
DRIVER NDL LRG 8 DVNC XI (INSTRUMENTS) ×1 IMPLANT
DRIVER NDLE LRG 8 DVNC XI (INSTRUMENTS) ×1 IMPLANT
ELECT REM PT RETURN 9FT ADLT (ELECTROSURGICAL) ×1 IMPLANT
ELECTRODE REM PT RTRN 9FT ADLT (ELECTROSURGICAL) ×1 IMPLANT
FORCEPS BPLR FENES DVNC XI (FORCEP) ×1 IMPLANT
FORCEPS BPLR R/ABLATION 8 DVNC (INSTRUMENTS) ×1 IMPLANT
GAUZE 4X4 16PLY ~~LOC~~+RFID DBL (SPONGE) IMPLANT
GLOVE BIOGEL PI IND STRL 7.5 (GLOVE) ×2 IMPLANT
GLOVE SURG SYN 7.0 (GLOVE) ×2 IMPLANT
GLOVE SURG SYN 7.0 PF PI (GLOVE) ×2 IMPLANT
GOWN STRL REUS W/ TWL LRG LVL3 (GOWN DISPOSABLE) ×3 IMPLANT
IRRIGATOR SUCT 8 DISP DVNC XI (IRRIGATION / IRRIGATOR) IMPLANT
IV NS 1000ML BAXH (IV SOLUTION) IMPLANT
KIT PINK PAD W/HEAD ARE REST (MISCELLANEOUS) ×1 IMPLANT
KIT PINK PAD W/HEAD ARM REST (MISCELLANEOUS) ×1 IMPLANT
LABEL OR SOLS (LABEL) ×1 IMPLANT
MANIFOLD NEPTUNE II (INSTRUMENTS) ×1 IMPLANT
MESH 3DMAX 5X7 RT XLRG (Mesh General) IMPLANT
NDL HYPO 22X1.5 SAFETY MO (MISCELLANEOUS) ×1 IMPLANT
NDL INSUFFLATION 14GA 120MM (NEEDLE) ×1 IMPLANT
NEEDLE HYPO 22X1.5 SAFETY MO (MISCELLANEOUS) ×1 IMPLANT
NEEDLE INSUFFLATION 14GA 120MM (NEEDLE) ×1 IMPLANT
NS IRRIG 500ML POUR BTL (IV SOLUTION) ×1 IMPLANT
OBTURATOR OPTICAL STND 8 DVNC (TROCAR) ×1 IMPLANT
OBTURATOR OPTICALSTD 8 DVNC (TROCAR) ×1 IMPLANT
PACK BASIN MINOR ARMC (MISCELLANEOUS) ×1 IMPLANT
PACK LAP CHOLECYSTECTOMY (MISCELLANEOUS) ×1 IMPLANT
SCISSORS MNPLR CVD DVNC XI (INSTRUMENTS) ×1 IMPLANT
SEAL UNIV 5-12 XI (MISCELLANEOUS) ×3 IMPLANT
SET TUBE SMOKE EVAC HIGH FLOW (TUBING) ×1 IMPLANT
SOL ELECTROSURG ANTI STICK (MISCELLANEOUS) ×1 IMPLANT
SOLUTION ELECTROSURG ANTI STCK (MISCELLANEOUS) ×1 IMPLANT
SUT MNCRL 4-0 27 PS-2 XMFL (SUTURE) ×1 IMPLANT
SUT PROLENE 2 0 SH DA (SUTURE) ×3 IMPLANT
SUT SILK 3-0 18XBRD TIE 12 (SUTURE) ×1 IMPLANT
SUT STRATA 2-0 23CM CT-2 (SUTURE) ×1 IMPLANT
SUT VIC AB 2-0 SH 27XBRD (SUTURE) ×1 IMPLANT
SUT VIC AB 3-0 SH 27X BRD (SUTURE) ×2 IMPLANT
SUTURE MNCRL 4-0 27XMF (SUTURE) ×1 IMPLANT
SYR 10ML LL (SYRINGE) ×1 IMPLANT
TAPE TRANSPORE STRL 2 31045 (GAUZE/BANDAGES/DRESSINGS) IMPLANT
TRAP FLUID SMOKE EVACUATOR (MISCELLANEOUS) ×1 IMPLANT
WATER STERILE IRR 500ML POUR (IV SOLUTION) ×1 IMPLANT

## 2023-10-12 NOTE — Op Note (Signed)
 Procedure Date:  10/12/2023  Pre-operative Diagnosis:  Recurrent right inguinal hernia  Post-operative Diagnosis: Same, indirect hernia  Procedure: 1.  Robotic assisted Right Inguinal Hernia Repair 2.  Creation of Right Posterior Rectus-Transversalis Fascia Advancment Flap for Coverage of Pelvic Wound (200 cm)  Surgeon:  Baker Pierini, M.D.   Anesthesia:  General endotracheal  Estimated Blood Loss:  10 ml  Specimens:  None  Complications:  None  Indications for Procedure:  This is a 53 y.o. male who presents with a right inguinal hernia.  The options of surgery versus observation were reviewed with the patient and/or family. The risks of bleeding, abscess or infection, recurrence of symptoms, potential for an open procedure, injury to surrounding structures, and chronic pain were all discussed with the patient and he was willing to proceed.  We have planned this transabdominal procedure with the creation of right pre peritoneal flap based on the posterior rectus sheath and transversalis fascia in order to fully cover the mesh, creating a natural tisssue barrier for the bowel and peritoneal cavity.  Description of Procedure: The patient was correctly identified in the preoperative area and brought into the operating room.  The patient was placed supine with VTE prophylaxis in place.  Appropriate time-outs were performed.  Anesthesia was induced and the patient was intubated.   Appropriate antibiotics were infused.  The abdomen was prepped and draped in a sterile fashion. An incision was made at [facility] and a veress needle was placed into the abdomen using standard drop technique. Pneumoperitoneum was then established. Using an optiview trocar a supra-umbilical robotic port was placed. No injury was noted at veress needle insertion site. Two 8-mm robotic ports were placed in the right and left lateral positions under direct visualization.    The [employer] platform was docked onto the  patient, the camera was inserted and targeted, and the instruments were placed under direct visualization.  Both inguinal regions were inspected for hernias and it was confirmed that the patient had a right inguinal hernia.  Using electocautery, the peritoneal and posterior rectus tissue flap was created.  The peritoneum on the right side was scored from the median umbilical ligament laterally towards the ASIS.  The flap was mobilized using robotic scissors and the bipolar instruments, creating a plane along the posterior rectus sheath and transversalis fascia down to the pubic tubercle medially. It was then further mobilized laterally across the inguinal canal and femoral vessels and onto the psoas muscle. The inferior epigastric vessels were identified and preserved. This created a posterior rectus and peritoneal flap measuring roughly 17 cm x 12 cm.  The hernia sac and contents were reduced preserving all structures. The patient had a indirect right hernia defect with small cord lipoma. An extra large 3D Maxx polypropelene mesh was then inserted into the abdomen along with a 2-0 v-lock suture. The mesh was placed into the pre-peritoneal space and there was good coverage of all hernia spaces. The mesh was sewn to the abdominal wall anteriorly with a 2-0 vicryl suture.Then, the peritoneal flap was advanced over the mesh and carried over to close the defect. A running 2-0 V lock suture was used to approximate the edge of the flap onto the peritoneum.   All needles were removed under direct visualization.  The 8- mm ports were removed under direct visualization and the Hasson trocar was removed.   Local anesthetic was infused in all incisions  and the incisions were closed with 4-0 Monocryl.  The wounds were  cleaned and sealed with DermaBond. The patient was emerged from anesthesia and extubated and brought to the recovery room for further management.  The patient tolerated the procedure well and all counts  were correct at the end of the case.   Baker Pierini, M.D.

## 2023-10-12 NOTE — Transfer of Care (Signed)
 Immediate Anesthesia Transfer of Care Note  Patient: Andrew Bond  Procedure(s) Performed: REPAIR, HERNIA, INGUINAL, ROBOT-ASSISTED, LAPAROSCOPIC, USING MESH (Right) REPAIR, HERNIA, INGUINAL, ADULT (Right)  Patient Location: PACU  Anesthesia Type:General  Level of Consciousness: awake, alert , and pateint uncooperative  Airway & Oxygen Therapy: Patient Spontanous Breathing and Patient connected to face mask oxygen  Post-op Assessment: Report given to RN and Post -op Vital signs reviewed and stable  Post vital signs: stable  Last Vitals:  Vitals Value Taken Time  BP 137/93 10/12/23 1017  Temp    Pulse 81 10/12/23 1019  Resp 24 10/12/23 1019  SpO2 100 % 10/12/23 1019  Vitals shown include unfiled device data.  Last Pain:  Vitals:   10/12/23 0623  TempSrc: Oral  PainSc: 2          Complications: No notable events documented.

## 2023-10-12 NOTE — Anesthesia Preprocedure Evaluation (Addendum)
 Anesthesia Evaluation  Patient identified by MRN, date of birth, ID band Patient awake    Reviewed: Allergy & Precautions, H&P , NPO status , Patient's Chart, lab work & pertinent test results  History of Anesthesia Complications (+) history of anesthetic complications ("slow to wake up")  Airway Mallampati: II  TM Distance: >3 FB     Dental  (+) Teeth Intact, Dental Advidsory Given, Caps, Missing, Chipped   Pulmonary neg shortness of breath, sleep apnea (suspected) , neg COPD, neg recent URI, Patient abstained from smoking., former smoker   breath sounds clear to auscultation       Cardiovascular hypertension, (-) angina (-) Past MI and (-) Cardiac Stents (-) dysrhythmias (-) Valvular Problems/Murmurs Rhythm:regular Rate:Normal     Neuro/Psych negative neurological ROS  negative psych ROS   GI/Hepatic Neg liver ROS,GERD  ,,  Endo/Other  negative endocrine ROS    Renal/GU negative Renal ROS  negative genitourinary   Musculoskeletal   Abdominal   Peds  Hematology negative hematology ROS (+)   Anesthesia Other Findings Past Medical History: 04/01/2015: Allergic rhinitis due to pollen No date: Complication of anesthesia     Comment:  hard time waking up when I was 53 years old, carpal               tunnel surgery 04/22/2015: Dyslipidemia 04/01/2015: Essential hypertension, benign 04/16/2016: Hyperglycemia  Past Surgical History: No date: EYE SURGERY No date: HERNIA REPAIR     Comment:  x6 No date: NM RENAL LASIX ([facility] HX) No date: REFRACTIVE SURGERY No date: TOE SURGERY No date: WRIST SURGERY; Bilateral     Comment:  carpal tunnel     Reproductive/Obstetrics negative OB ROS                             Anesthesia Physical Anesthesia Plan  ASA: 2  Anesthesia Plan: General   Post-op Pain Management:    Induction: Intravenous, Rapid sequence and Cricoid pressure planned  PONV  Risk Score and Plan: 2 and Ondansetron, Dexamethasone, Midazolam and Treatment may vary due to age or medical condition  Airway Management Planned: Oral ETT  Additional Equipment:   Intra-op Plan:   Post-operative Plan: Extubation in OR  Informed Consent: I have reviewed the patients History and Physical, chart, labs and discussed the procedure including the risks, benefits and alternatives for the proposed anesthesia with the patient or authorized representative who has indicated his/her understanding and acceptance.     Dental Advisory Given  Plan Discussed with: Anesthesiologist, CRNA and Surgeon  Anesthesia Plan Comments: (Patient last took his GLP-1 on Saturday (3-8) and is willing to proceed with rapid sequence induction given increased risk of aspiration.  We discussed at length the increased risk of aspiration that could lead to lung disease and even death.  He is willing to proceed at this time and accept those risks.)        Anesthesia Quick Evaluation

## 2023-10-12 NOTE — Anesthesia Procedure Notes (Signed)
 Procedure Name: Intubation Date/Time: 10/12/2023 7:47 AM  Performed by: Maryla Morrow., CRNAPre-anesthesia Checklist: Patient identified, Patient being monitored, Timeout performed, Emergency Drugs available and Suction available Patient Re-evaluated:Patient Re-evaluated prior to induction Oxygen Delivery Method: Circle system utilized Preoxygenation: Pre-oxygenation with 100% oxygen Induction Type: IV induction and Rapid sequence Laryngoscope Size: 3 and McGrath Grade View: Grade I Tube type: Oral Tube size: 7.5 mm Number of attempts: 1 Airway Equipment and Method: Stylet Placement Confirmation: ETT inserted through vocal cords under direct vision, positive ETCO2 and breath sounds checked- equal and bilateral Secured at: 22 cm Tube secured with: Tape Dental Injury: Teeth and Oropharynx as per pre-operative assessment

## 2023-10-12 NOTE — H&P (Signed)
 Patient with hypokalemia on BMP yesterday, he received oral K supplementation and K this morning over 3.0. Proceed with RIGHT inguinal hernia repair, robotic with mesh. He will need to follow up with PCP post-operatively to discusss need for K supplementation.     CC: Right Inguinal Hernia History of Present Illness Andrew Bond is a 53 y.o. male with past surgical history significant for open right inguinal hernia who presents in consultation for right inguinal hernia the patient reports that several weeks ago he.  Noted a bulge in his right groin.  Since then he has had pain in his right groin.  He describes the pain is radiating down to the scrotum.  He denies any overlying skin changes, nausea or vomiting.  He reports that when he lays down the bulge will go up again.  He says that he had a right inguinal hernia repair several years ago and they did that open and believes that they did use mesh.  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   Past Medical History     Past Medical History:  Diagnosis Date   Allergic rhinitis due to pollen 04/01/2015   Complication of anesthesia      hard time waking up when I was 53 years old, carpal tunnel surgery   Dyslipidemia 04/22/2015   Essential hypertension, benign 04/01/2015   Hyperglycemia 04/16/2016                 Past Surgical History:  Procedure Laterality Date   COLONOSCOPY WITH PROPOFOL N/A 10/21/2020    Procedure: COLONOSCOPY WITH PROPOFOL;  Surgeon: Wyline Mood, MD;  Location: [facility] ENDOSCOPY;  Service: Gastroenterology;  Laterality: N/A;   EYE SURGERY       HERNIA REPAIR        multiple   NM RENAL LASIX ([facility] HX)       REFRACTIVE SURGERY       TOE SURGERY Bilateral      big toe   WRIST SURGERY Bilateral      carpal tunnel          Allergies      Allergies  Allergen Reactions   Codeine Nausea And Vomiting              Current Outpatient Medications  Medication Sig Dispense Refill   atorvastatin  (LIPITOR) 40 MG tablet Take 1 tablet (40 mg total) by mouth at bedtime. 90 tablet 3   chlorthalidone (HYGROTON) 50 MG tablet Take 50 mg by mouth daily.        fluticasone (FLONASE) 50 MCG/ACT nasal spray Place 2 sprays into both nostrils daily as needed. 48 g 3   loratadine (CLARITIN) 10 MG tablet Take 10 mg by mouth daily as needed.        losartan (COZAAR) 50 MG tablet Take 50 mg by mouth daily.       omeprazole (PRILOSEC) 20 MG capsule Take 1 capsule (20 mg total) by mouth daily. 90 capsule 0      No current facility-administered medications for this visit.        Family History      Family History  Problem Relation Age of Onset   Hypertension Mother     Cancer Mother          cervical   Heart disease Father     Heart attack Father     Hypertension Brother     Hyperlipidemia Brother     Cancer Maternal  Grandmother          lymph nodes, spread to lungs            Social History Social History  Social History         Tobacco Use   Smoking status: Former      Current packs/day: 0.00      Average packs/day: 0.5 packs/day for 10.0 years (5.0 ttl pk-yrs)      Types: Cigarettes      Start date: 08/02/2004      Quit date: 08/02/2014      Years since quitting: 9.1      Passive exposure: Past   Smokeless tobacco: Never  Vaping Use   Vaping status: Never Used  Substance Use Topics   Alcohol use: No   Drug use: No            ROS Full ROS of systems performed and is otherwise negative there than what is stated in the HPI   Physical Exam Blood pressure 119/77, pulse 89, temperature 98 F (36.7 C), height 5' 7.5" (1.715 m), weight 189 lb (85.7 kg), SpO2 98%.   Alert and oriented x 3, clear to auscultation bilaterally, regular rate and rhythm, abdomen soft, obese no umbilical hernia, nontender throughout.  Groin examination performed in the presence of a chaperone.  On his right groin there is a with Valsalva.  When I have him lay down there is no bulge noticeable.  No  appreciable large hernia on the left side. On the right groin there is a well-healed surgical scar Data Reviewed Outside records reviewed from his primary care doctor who saw him for a right inguinal bulge and referred him to our office for evaluation.   I have personally reviewed the patient's imaging and medical records.     Assessment Assessment Patient with right inguinal hernia, recurrent   Plan Plan Discussed risk, benefits alternatives of inguinal hernia repair including risk of infection, bleeding, injury to the vas deferens, testicular ischemia, chronic pain, need for open procedure and recurrence.  I also discussed that we would do this minimally invasive using robotic platform.  We will plan to replace the mesh in the hernia defect.  I discussed with him that given his previous surgery that always increases the risk of complications and a reoperative field.  He understands these risks and wishes to proceed.  For robotic assisted right inguinal hernia       Kandis Cocking 10/07/2023, 12:26 PM

## 2023-10-14 ENCOUNTER — Encounter: Payer: Self-pay | Admitting: General Surgery

## 2023-10-14 NOTE — Anesthesia Postprocedure Evaluation (Signed)
 Anesthesia Post Note  Patient: Andrew Bond  Procedure(s) Performed: REPAIR, HERNIA, INGUINAL, ROBOT-ASSISTED, LAPAROSCOPIC, USING MESH (Right) REPAIR, HERNIA, INGUINAL, ADULT (Right)  Patient location during evaluation: PACU Anesthesia Type: General Level of consciousness: awake and alert Pain management: pain level controlled Vital Signs Assessment: post-procedure vital signs reviewed and stable Respiratory status: spontaneous breathing, nonlabored ventilation, respiratory function stable and patient connected to nasal cannula oxygen Cardiovascular status: blood pressure returned to baseline and stable Postop Assessment: no apparent nausea or vomiting Anesthetic complications: no   There were no known notable events for this encounter.   Last Vitals:  Vitals:   10/12/23 1058 10/12/23 1115  BP:  (!) 142/95  Pulse: 85 83  Resp: 13 20  Temp:  36.4 C  SpO2: 97% 98%    Last Pain:  Vitals:   10/12/23 1115  TempSrc: Temporal  PainSc: 5                  Lenard Simmer

## 2023-10-24 ENCOUNTER — Telehealth: Payer: Self-pay | Admitting: *Deleted

## 2023-10-24 NOTE — Telephone Encounter (Signed)
 Left message for patient to let him know that the FMLA is ready to be picked up

## 2023-10-27 ENCOUNTER — Encounter: Payer: Self-pay | Admitting: General Surgery

## 2023-10-27 ENCOUNTER — Ambulatory Visit (INDEPENDENT_AMBULATORY_CARE_PROVIDER_SITE_OTHER): Admitting: General Surgery

## 2023-10-27 VITALS — BP 135/85 | HR 97 | Temp 98.3°F | Ht 67.5 in | Wt 189.0 lb

## 2023-10-27 DIAGNOSIS — Z09 Encounter for follow-up examination after completed treatment for conditions other than malignant neoplasm: Secondary | ICD-10-CM

## 2023-10-27 DIAGNOSIS — K4091 Unilateral inguinal hernia, without obstruction or gangrene, recurrent: Secondary | ICD-10-CM

## 2023-10-27 NOTE — Progress Notes (Signed)
 Outpatient Surgical Follow Up  10/27/2023  Andrew Andres [facility] Bond is an 53 y.o. male.   Chief Complaint  Patient presents with   Routine Post Op    Inguinal hernia repair 10/12/23    HPI: Patient's status post robotic assisted right inguinal hernia repair.  He reports doing well.  He still has a little bit of pain over his groin.  He reports that this is 2 out of 10.  He denies any radiation down to his scrotum or mid thigh.  He is tolerating a diet and having normal bowel movements.  Incisions are healing well without any erythema.  Past Medical History:  Diagnosis Date   Allergic rhinitis due to pollen 04/01/2015   Complication of anesthesia    hard time waking up when I was 53 years old, carpal tunnel surgery   Dyslipidemia 04/22/2015   Essential hypertension, benign 04/01/2015   GERD (gastroesophageal reflux disease)    Hyperglycemia 04/16/2016    Past Surgical History:  Procedure Laterality Date   COLONOSCOPY WITH PROPOFOL N/A 10/21/2020   Procedure: COLONOSCOPY WITH PROPOFOL;  Surgeon: Wyline Mood, MD;  Location: [facility] ENDOSCOPY;  Service: Gastroenterology;  Laterality: N/A;   EYE SURGERY     HERNIA REPAIR     multiple   INGUINAL HERNIA REPAIR Right 10/12/2023   Procedure: REPAIR, HERNIA, INGUINAL, ADULT;  Surgeon: Kandis Cocking, MD;  Location: [facility] ORS;  Service: General;  Laterality: Right;  Possible bilateral   NM RENAL LASIX ([facility] HX)     REFRACTIVE SURGERY     TOE SURGERY Bilateral    big toe   WRIST SURGERY Bilateral    carpal tunnel   XI ROBOTIC ASSISTED INGUINAL HERNIA REPAIR WITH MESH Right 10/12/2023   Procedure: REPAIR, HERNIA, INGUINAL, ROBOT-ASSISTED, LAPAROSCOPIC, USING MESH;  Surgeon: Kandis Cocking, MD;  Location: [facility] ORS;  Service: General;  Laterality: Right;  Possible bilateral repair Possible open    Family History  Problem Relation Age of Onset   Hypertension Mother    Cancer Mother        cervical   Heart disease Father    Heart  attack Father    Hypertension Brother    Hyperlipidemia Brother    Cancer Maternal Grandmother        lymph nodes, spread to lungs    Social History:  reports that he quit smoking about 9 years ago. His smoking use included cigarettes. He started smoking about 19 years ago. He has a 5 pack-year smoking history. He has been exposed to tobacco smoke. He has never used smokeless tobacco. He reports that he does not drink alcohol and does not use drugs.  Allergies:  Allergies  Allergen Reactions   Codeine Nausea And Vomiting    Medications reviewed.    ROS Full ROS performed and is otherwise negative other than what is stated in HPI   BP 135/85   Pulse 97   Temp 98.3 F (36.8 C) (Oral)   Ht 5' 7.5" (1.715 m)   Wt 189 lb (85.7 kg)   SpO2 98%   BMI 29.16 kg/m   Physical Exam  Abdominal incisions are well-healed.  The glue has peeled off.  There is no erythema or drainage from the wounds.  Right groin some tenderness to palpation.  No evidence of recurrence with Valsalva.  He does have some soreness over his previous incision from his open inguinal hernia repair.  No redness.   No results found for this or any previous  visit (from the past 48 hours). No results found.  Assessment/Plan:  Patient's status post robotic assisted right inguinal hernia repair.  Doing well.  Still having little bit of pain but is improving.  Discussed with him continue lifting restrictions including no greater than 10 pounds for the next 4 weeks.  He can return to light duty work on Monday.  We have provided a note for him.  He can follow-up with [place] on an as-needed basis   Baker Pierini, M.D. [facility]

## 2023-10-27 NOTE — Patient Instructions (Signed)

## 2023-10-28 ENCOUNTER — Ambulatory Visit: Payer: BC Managed Care – PPO | Admitting: Urology

## 2024-01-02 ENCOUNTER — Telehealth: Payer: Self-pay

## 2024-01-02 ENCOUNTER — Other Ambulatory Visit: Payer: Self-pay

## 2024-01-02 DIAGNOSIS — Z860100 Personal history of colon polyps, unspecified: Secondary | ICD-10-CM

## 2024-01-02 MED ORDER — NA SULFATE-K SULFATE-MG SULF 17.5-3.13-1.6 GM/177ML PO SOLN: 1.0000 | Freq: Once | ORAL | 0 refills | Status: AC

## 2024-01-02 NOTE — Telephone Encounter (Signed)
 Gastroenterology Pre-Procedure Review  Request Date: 01/12/24 Requesting Physician: Dr. Ole Berkeley  PATIENT REVIEW QUESTIONS: The patient's wife Doylene Genet [facility] Checked) responded to the following health history questions as indicated:    1. Are you having any GI issues? no 2. Do you have a personal history of Polyps? yes (last colonoscopy performed by Dr. Antony Baumgartner 10/21/20 recommended repeat in 3 years) 3. Do you have a family history of Colon Cancer or Polyps? no 4. Diabetes Mellitus? no Has not started Zepbound yet but wife  has been advised to have her husband stop it (7) days prior to colonoscopy 5. Joint replacements in the past 12 months?No replacements however hernia surgery in March 6. Major health problems in the past 3 months?no 7. Any artificial heart valves, MVP, or defibrillator?no    MEDICATIONS & ALLERGIES:    Patient reports the following regarding taking any anticoagulation/antiplatelet therapy:   Plavix, Coumadin, Eliquis, Xarelto, Lovenox , Pradaxa, Brilinta, or Effient? no Aspirin ? no  Patient confirms/reports the following medications:  Current Outpatient Medications  Medication Sig Dispense Refill   atorvastatin  (LIPITOR) 40 MG tablet Take 1 tablet (40 mg total) by mouth at bedtime. 90 tablet 3   chlorthalidone (HYGROTON) 50 MG tablet Take 50 mg by mouth daily.      loratadine (CLARITIN) 10 MG tablet Take 10 mg by mouth daily as needed.      losartan  (COZAAR ) 50 MG tablet Take 50 mg by mouth daily.     Olopatadine-Mometasone (RYALTRIS NA) Place into the nose.     omeprazole  (PRILOSEC) 20 MG capsule Take 1 capsule (20 mg total) by mouth daily. 90 capsule 0   potassium chloride  SA (KLOR-CON  M) 20 MEQ tablet Take 3 tablets (60 mEq total) by mouth 2 (two) times daily for 2 doses. 5 tablet 0   tirzepatide (ZEPBOUND) 2.5 MG/0.5ML Pen Inject 2.5 mg into the skin once a week.     No current facility-administered medications for this visit.    Patient confirms/reports the following  allergies:  Allergies  Allergen Reactions   Codeine Nausea And Vomiting    No orders of the defined types were placed in this encounter.   AUTHORIZATION INFORMATION Primary Insurance: 1D#: Group #:  Secondary Insurance: 1D#: Group #:  SCHEDULE INFORMATION: Date: 01/12/24 Time: Location: [facility]

## 2024-01-12 ENCOUNTER — Ambulatory Visit: Admitting: General Practice

## 2024-01-12 ENCOUNTER — Encounter: Payer: Self-pay | Admitting: Gastroenterology

## 2024-01-12 ENCOUNTER — Ambulatory Visit
Admission: RE | Admit: 2024-01-12 | Discharge: 2024-01-12 | Disposition: A | Discharge status: Home / Self Care | Attending: Gastroenterology | Admitting: Gastroenterology

## 2024-01-12 ENCOUNTER — Other Ambulatory Visit: Payer: Self-pay

## 2024-01-12 ENCOUNTER — Encounter
Admission: RE | Disposition: A | Discharge status: Home / Self Care | Payer: Self-pay | Source: Home / Self Care | Attending: Gastroenterology

## 2024-01-12 DIAGNOSIS — Z860100 Personal history of colon polyps, unspecified: Secondary | ICD-10-CM

## 2024-01-12 DIAGNOSIS — K640 First degree hemorrhoids: Secondary | ICD-10-CM | POA: Diagnosis not present

## 2024-01-12 DIAGNOSIS — I10 Essential (primary) hypertension: Secondary | ICD-10-CM | POA: Insufficient documentation

## 2024-01-12 DIAGNOSIS — K635 Polyp of colon: Secondary | ICD-10-CM | POA: Diagnosis not present

## 2024-01-12 DIAGNOSIS — K219 Gastro-esophageal reflux disease without esophagitis: Secondary | ICD-10-CM | POA: Diagnosis not present

## 2024-01-12 DIAGNOSIS — Z87891 Personal history of nicotine dependence: Secondary | ICD-10-CM | POA: Insufficient documentation

## 2024-01-12 DIAGNOSIS — K5939 Other megacolon: Secondary | ICD-10-CM | POA: Diagnosis not present

## 2024-01-12 DIAGNOSIS — Z1211 Encounter for screening for malignant neoplasm of colon: Secondary | ICD-10-CM | POA: Insufficient documentation

## 2024-01-12 DIAGNOSIS — G4730 Sleep apnea, unspecified: Secondary | ICD-10-CM | POA: Insufficient documentation

## 2024-01-12 HISTORY — PX: POLYPECTOMY: SHX149

## 2024-01-12 HISTORY — PX: COLONOSCOPY: SHX5424

## 2024-01-12 HISTORY — DX: Sleep apnea, unspecified: G47.30

## 2024-01-12 SURGERY — COLONOSCOPY
Anesthesia: General

## 2024-01-12 MED ORDER — PROPOFOL 500 MG/50ML IV EMUL
INTRAVENOUS | Status: DC | PRN
  Administered 2024-01-12: 75 ug/kg/min via INTRAVENOUS

## 2024-01-12 MED ORDER — LIDOCAINE HCL (PF) 2 % IJ SOLN
INTRAMUSCULAR | Status: AC
Start: 2024-01-12 — End: 2024-01-12
  Filled 2024-01-12: qty 15

## 2024-01-12 MED ORDER — EPHEDRINE 5 MG/ML INJ
INTRAVENOUS | Status: AC
  Filled 2024-01-12: qty 5

## 2024-01-12 MED ORDER — DEXMEDETOMIDINE HCL IN NACL 80 MCG/20ML IV SOLN
INTRAVENOUS | Status: DC | PRN
  Administered 2024-01-12: 20 ug via INTRAVENOUS

## 2024-01-12 MED ORDER — LIDOCAINE HCL (CARDIAC) PF 100 MG/5ML IV SOSY
PREFILLED_SYRINGE | INTRAVENOUS | Status: DC | PRN
  Administered 2024-01-12: 80 mg via INTRAVENOUS

## 2024-01-12 MED ORDER — SODIUM CHLORIDE 0.9 % IV SOLN: INTRAVENOUS | Status: DC

## 2024-01-12 MED ORDER — PROPOFOL 10 MG/ML IV BOLUS
INTRAVENOUS | Status: DC | PRN
  Administered 2024-01-12: 20 mg via INTRAVENOUS
  Administered 2024-01-12: 50 mg via INTRAVENOUS
  Administered 2024-01-12: 30 mg via INTRAVENOUS

## 2024-01-12 NOTE — H&P (Signed)
 Marnee Sink, MD [facility] [address], [address] [place], [place] [address] Phone:[phone] Fax : [phone]  Primary Care Physician:  Sharyne Degree, FNP Primary Gastroenterologist:  Dr. Ole Berkeley  Pre-Procedure History & Physical: HPI:  Andrew Bond is a 53 y.o. male is here for an colonoscopy.   Past Medical History:  Diagnosis Date   Allergic rhinitis due to pollen 04/01/2015   Complication of anesthesia    hard time waking up when I was 53 years old, carpal tunnel surgery   Dyslipidemia 04/22/2015   Essential hypertension, benign 04/01/2015   GERD (gastroesophageal reflux disease)    Hyperglycemia 04/16/2016   Sleep apnea     Past Surgical History:  Procedure Laterality Date   COLONOSCOPY WITH PROPOFOL  N/A 10/21/2020   Procedure: COLONOSCOPY WITH PROPOFOL ;  Surgeon: Luke Salaam, MD;  Location: [facility] ENDOSCOPY;  Service: Gastroenterology;  Laterality: N/A;   EYE SURGERY     HERNIA REPAIR     multiple   INGUINAL HERNIA REPAIR Right 10/12/2023   Procedure: REPAIR, HERNIA, INGUINAL, ADULT;  Surgeon: Barrett Lick, MD;  Location: [facility] ORS;  Service: General;  Laterality: Right;  Possible bilateral   NM RENAL LASIX ([facility] HX)     REFRACTIVE SURGERY     TOE SURGERY Bilateral    big toe   WRIST SURGERY Bilateral    carpal tunnel   XI ROBOTIC ASSISTED INGUINAL HERNIA REPAIR WITH MESH Right 10/12/2023   Procedure: REPAIR, HERNIA, INGUINAL, ROBOT-ASSISTED, LAPAROSCOPIC, USING MESH;  Surgeon: Barrett Lick, MD;  Location: [facility] ORS;  Service: General;  Laterality: Right;  Possible bilateral repair Possible open    Prior to Admission medications   Medication Sig Start Date End Date Taking? Authorizing Provider  atorvastatin  (LIPITOR) 40 MG tablet Take 1 tablet (40 mg total) by mouth at bedtime. 01/10/18  Yes Lada, Ernestine Heads, MD  chlorthalidone (HYGROTON) 50 MG tablet Take 50 mg by mouth daily.  10/20/19  Yes [provider]  loratadine (CLARITIN) 10 MG  tablet Take 10 mg by mouth daily as needed.    Yes [provider]  losartan  (COZAAR ) 50 MG tablet Take 50 mg by mouth daily. 10/11/19  Yes [provider]  omeprazole  (PRILOSEC) 20 MG capsule Take 1 capsule (20 mg total) by mouth daily. 10/16/20  Yes Luke Salaam, MD  potassium chloride  SA (KLOR-CON  M) 20 MEQ tablet Take 20 mEq by mouth 2 (two) times daily as needed.   Yes [provider]  Olopatadine-Mometasone (RYALTRIS NA) Place into the nose.    [provider]  potassium chloride  SA (KLOR-CON  M) 20 MEQ tablet Take 3 tablets (60 mEq total) by mouth 2 (two) times daily for 2 doses. 10/11/23 10/12/23  Barrett Lick, MD  tirzepatide [facility]) 2.5 MG/0.5ML Pen Inject 2.5 mg into the skin once a week.    [provider]    Allergies as of 01/02/2024 - Review Complete 01/02/2024  Allergen Reaction Noted   Codeine Nausea And Vomiting 03/29/2015    Family History  Problem Relation Age of Onset   Hypertension Mother    Cancer Mother        cervical   Heart disease Father    Heart attack Father    Hypertension Brother    Hyperlipidemia Brother    Cancer Maternal Grandmother        lymph nodes, spread to lungs    Social History   Socioeconomic History   Marital status: Married    Spouse  name: Not on file   Number of children: Not on file   Years of education: Not on file   Highest education level: Not on file  Occupational History   Not on file  Tobacco Use   Smoking status: Former    Current packs/day: 0.00    Average packs/day: 0.5 packs/day for 10.0 years (5.0 ttl pk-yrs)    Types: Cigarettes    Start date: 08/02/2004    Quit date: 08/02/2014    Years since quitting: 9.4    Passive exposure: Past   Smokeless tobacco: Never  Vaping Use   Vaping status: Never Used  Substance and Sexual Activity   Alcohol use: Yes    Comment: occasional beer   Drug use: No   Sexual activity: Yes  Other Topics Concern   Not on file  Social  History Narrative   Not on file   Social Drivers of Health   Financial Resource Strain: Not on file  Food Insecurity: Not on file  Transportation Needs: Not on file  Physical Activity: Not on file  Stress: Not on file  Social Connections: Not on file  Intimate Partner Violence: Not on file    Review of Systems: See HPI, otherwise negative ROS  Physical Exam: BP 139/72   Pulse 79   Temp (!) 96.7 F (35.9 C) (Temporal)   Resp 18   Wt 84.4 kg   SpO2 98%   BMI 28.70 kg/m  General:   Alert,  pleasant and cooperative in NAD Head:  Normocephalic and atraumatic. Neck:  Supple; no masses or thyromegaly. Lungs:  Clear throughout to auscultation.    Heart:  Regular rate and rhythm. Abdomen:  Soft, nontender and nondistended. Normal bowel sounds, without guarding, and without rebound.   Neurologic:  Alert and  oriented x4;  grossly normal neurologically.  Impression/Plan: Andrew Bond is here for an colonoscopy to be performed for a history of adenomatous polyps on 2022  Risks, benefits, limitations, and alternatives regarding  colonoscopy have been reviewed with the patient.  Questions have been answered.  All parties agreeable.   Marnee Sink, MD  01/12/2024, 11:08 AM

## 2024-01-12 NOTE — Op Note (Signed)
 [facility] Gastroenterology Patient Name: Andrew Bond Procedure Date: 01/12/2024 12:02 PM MRN: [id number] Account #: [id number] Date of Birth: 1970/11/12 Admit Type: Outpatient Age: 53 Room: [facility] ENDO ROOM 4 Gender: Male Note Status: Finalized Instrument Name: Charlyn Cooley [id number] Procedure:             Colonoscopy Indications:           High risk colon cancer surveillance: Personal history                         of colonic polyps Providers:             Marnee Sink MD, MD Referring MD:          Argentina Kugel. Rozetta Corns (Referring MD) Medicines:             Propofol  per Anesthesia Complications:         No immediate complications. Procedure:             Pre-Anesthesia Assessment:                        - Prior to the procedure, a History and Physical was                         performed, and patient medications and allergies were                         reviewed. The patient's tolerance of previous                         anesthesia was also reviewed. The risks and benefits                         of the procedure and the sedation options and risks                         were discussed with the patient. All questions were                         answered, and informed consent was obtained. Prior                         Anticoagulants: The patient has taken no anticoagulant                         or antiplatelet agents. ASA Grade Assessment: II - A                         patient with mild systemic disease. After reviewing                         the risks and benefits, the patient was deemed in                         satisfactory condition to undergo the procedure.                        After obtaining informed consent, the colonoscope was  passed under direct vision. Throughout the procedure,                         the patient's blood pressure, pulse, and oxygen                         saturations were monitored continuously. The                          Colonoscope was introduced through the anus and                         advanced to the the cecum, identified by appendiceal                         orifice and ileocecal valve. The colonoscopy was                         performed without difficulty. The patient tolerated                         the procedure well. The quality of the bowel                         preparation was excellent. Findings:      The perianal and digital rectal examinations were normal.      A 4 mm polyp was found in the sigmoid colon. The polyp was sessile. The       polyp was removed with a cold snare. Resection and retrieval were       complete.      Non-bleeding internal hemorrhoids were found during retroflexion. The       hemorrhoids were Grade I (internal hemorrhoids that do not prolapse). Impression:            - One 4 mm polyp in the sigmoid colon, removed with a                         cold snare. Resected and retrieved.                        - Non-bleeding internal hemorrhoids. Recommendation:        - Discharge patient to home.                        - Resume previous diet.                        - Continue present medications.                        - Await pathology results.                        - Repeat colonoscopy in 7 years for surveillance. Procedure Code(s):     --- Professional ---                        [phone], Colonoscopy, flexible; with removal of  tumor(s), polyp(s), or other lesion(s) by snare                         technique Diagnosis Code(s):     --- Professional ---                        Z86.010, Personal history of colonic polyps                        D12.5, Benign neoplasm of sigmoid colon CPT copyright 2022 American Medical Association. All rights reserved. The codes documented in this report are preliminary and upon coder review may  be revised to meet current compliance requirements. Marnee Sink MD, MD 01/12/2024 12:36:58 PM This  report has been signed electronically. Number of Addenda: 0 Note Initiated On: 01/12/2024 12:02 PM Scope Withdrawal Time: 0 hours 6 minutes 47 seconds  Total Procedure Duration: 0 hours 11 minutes 24 seconds  Estimated Blood Loss:  Estimated blood loss: none.      [facility]

## 2024-01-12 NOTE — Anesthesia Preprocedure Evaluation (Signed)
 Anesthesia Evaluation  Patient identified by MRN, date of birth, ID band Patient awake    Reviewed: Allergy & Precautions, NPO status , Patient's Chart, lab work & pertinent test results  Airway Mallampati: III  TM Distance: >3 FB Neck ROM: full    Dental  (+) Chipped   Pulmonary neg pulmonary ROS, former smoker   Pulmonary exam normal        Cardiovascular hypertension, negative cardio ROS Normal cardiovascular exam     Neuro/Psych negative neurological ROS  negative psych ROS   GI/Hepatic Neg liver ROS,GERD  Medicated,,  Endo/Other  negative endocrine ROS    Renal/GU negative Renal ROS  negative genitourinary   Musculoskeletal   Abdominal   Peds  Hematology negative hematology ROS (+)   Anesthesia Other Findings Past Medical History: 04/01/2015: Allergic rhinitis due to pollen No date: Complication of anesthesia     Comment:  hard time waking up when I was 53 years old, carpal               tunnel surgery 04/22/2015: Dyslipidemia 04/01/2015: Essential hypertension, benign No date: GERD (gastroesophageal reflux disease) 04/16/2016: Hyperglycemia No date: Sleep apnea  Past Surgical History: 10/21/2020: COLONOSCOPY WITH PROPOFOL ; N/A     Comment:  Procedure: COLONOSCOPY WITH PROPOFOL ;  Surgeon: Luke Salaam, MD;  Location: [facility] ENDOSCOPY;  Service:               Gastroenterology;  Laterality: N/A; No date: EYE SURGERY No date: HERNIA REPAIR     Comment:  multiple 10/12/2023: INGUINAL HERNIA REPAIR; Right     Comment:  Procedure: REPAIR, HERNIA, INGUINAL, ADULT;  Surgeon:               Barrett Lick, MD;  Location: [facility] ORS;  Service:               General;  Laterality: Right;  Possible bilateral No date: NM RENAL LASIX ([facility] HX) No date: REFRACTIVE SURGERY No date: TOE SURGERY; Bilateral     Comment:  big toe No date: WRIST SURGERY; Bilateral     Comment:  carpal tunnel 10/12/2023: XI  ROBOTIC ASSISTED INGUINAL HERNIA REPAIR WITH MESH; Right     Comment:  Procedure: REPAIR, HERNIA, INGUINAL, ROBOT-ASSISTED,               LAPAROSCOPIC, USING MESH;  Surgeon: Barrett Lick, MD;              Location: [facility] ORS;  Service: General;  Laterality:               Right;  Possible bilateral repair Possible open  BMI    Body Mass Index: 28.70 kg/m      Reproductive/Obstetrics negative OB ROS                             Anesthesia Physical Anesthesia Plan  ASA: 2  Anesthesia Plan: General   Post-op Pain Management: Minimal or no pain anticipated   Induction: Intravenous  PONV Risk Score and Plan: 3 and Propofol  infusion, TIVA and Ondansetron   Airway Management Planned: Nasal Cannula  Additional Equipment: None  Intra-op Plan:   Post-operative Plan:   Informed Consent: I have reviewed the patients History and Physical, chart, labs and discussed the procedure including the risks, benefits and alternatives for the proposed anesthesia with the patient or authorized representative  who has indicated his/her understanding and acceptance.     Dental advisory given  Plan Discussed with: CRNA and Surgeon  Anesthesia Plan Comments: (Discussed risks of anesthesia with patient, including possibility of difficulty with spontaneous ventilation under anesthesia necessitating airway intervention, PONV, and rare risks such as cardiac or respiratory or neurological events, and allergic reactions. Discussed the role of CRNA in patient's perioperative care. Patient understands.)       Anesthesia Quick Evaluation

## 2024-01-12 NOTE — Transfer of Care (Signed)
 Immediate Anesthesia Transfer of Care Note  Patient: Andrew Bond  Procedure(s) Performed: COLONOSCOPY POLYPECTOMY, INTESTINE  Patient Location: PACU  Anesthesia Type:General  Level of Consciousness: sedated  Airway & Oxygen Therapy: Patient Spontanous Breathing  Post-op Assessment: Report given to RN and Post -op Vital signs reviewed and stable  Post vital signs: Reviewed and stable  Last Vitals:  Vitals Value Taken Time  BP 99/65 01/12/24 12:38  Temp    Pulse 65 01/12/24 12:38  Resp 15 01/12/24 12:38  SpO2 98 % 01/12/24 12:38  Vitals shown include unfiled device data.  Last Pain:  Vitals:   01/12/24 1056  TempSrc: Temporal  PainSc: 0-No pain         Complications: No notable events documented.

## 2024-01-13 NOTE — Anesthesia Postprocedure Evaluation (Signed)
 Anesthesia Post Note  Patient: Nimesh Riolo Bellissimo III  Procedure(s) Performed: COLONOSCOPY POLYPECTOMY, INTESTINE  Patient location during evaluation: Endoscopy Anesthesia Type: General Level of consciousness: awake and alert Pain management: pain level controlled Vital Signs Assessment: post-procedure vital signs reviewed and stable Respiratory status: spontaneous breathing, nonlabored ventilation, respiratory function stable and patient connected to nasal cannula oxygen Cardiovascular status: blood pressure returned to baseline and stable Postop Assessment: no apparent nausea or vomiting Anesthetic complications: no  No notable events documented.   Last Vitals:  Vitals:   01/12/24 1247 01/12/24 1250  BP: 106/71 106/71  Pulse: 69 66  Resp: 16 15  Temp:    SpO2: 100% 98%    Last Pain:  Vitals:   01/13/24 0747  TempSrc:   PainSc: 0-No pain                 Enrique Harvest

## 2024-05-08 ENCOUNTER — Ambulatory Visit (INDEPENDENT_AMBULATORY_CARE_PROVIDER_SITE_OTHER): Admitting: General Surgery

## 2024-05-08 ENCOUNTER — Encounter: Payer: Self-pay | Admitting: General Surgery

## 2024-05-08 VITALS — BP 128/90 | HR 80 | Ht 67.5 in | Wt 178.0 lb

## 2024-05-08 DIAGNOSIS — R1031 Right lower quadrant pain: Secondary | ICD-10-CM | POA: Diagnosis not present

## 2024-05-08 DIAGNOSIS — K4091 Unilateral inguinal hernia, without obstruction or gangrene, recurrent: Secondary | ICD-10-CM

## 2024-05-08 MED ORDER — TRAMADOL HCL 50 MG PO TABS: 50.0000 mg | ORAL_TABLET | Freq: Four times a day (QID) | ORAL | 0 refills | Status: AC | PRN

## 2024-05-08 MED ORDER — LIDOCAINE 5 % EX PTCH: 1.0000 | MEDICATED_PATCH | CUTANEOUS | 3 refills | Status: AC

## 2024-05-08 NOTE — Patient Instructions (Signed)
 We will follow up with you in 4 weeks to check your progress.     Please call and ask to speak with a nurse if you develop questions or concerns.

## 2024-05-22 ENCOUNTER — Other Ambulatory Visit: Payer: Self-pay | Admitting: General Surgery

## 2024-05-22 ENCOUNTER — Inpatient Hospital Stay
Admission: RE | Admit: 2024-05-22 | Discharge: 2024-05-22 | Disposition: A | Payer: Self-pay | Source: Ambulatory Visit | Attending: General Surgery

## 2024-05-22 DIAGNOSIS — K4091 Unilateral inguinal hernia, without obstruction or gangrene, recurrent: Secondary | ICD-10-CM

## 2024-05-28 NOTE — Progress Notes (Signed)
 Outpatient Surgical Follow Up   Andrew Bond [facility] is an 53 y.o. male.   Chief Complaint  Patient presents with   Routine Post Op    HPI: Patient returns today status post right inguinal hernia repair.  He reports that since surgery he has had sharp pain in his right groin.  He is describes the pain as a stabbing pain.  He says that intermittently it does radiate to the right groin but denies any pain on the inside of his leg or testicle pain.  He reports that the pain is debilitating and constant.  He is unsure if there is any positions that alleviate or exacerbate the pain.  He actually went to an outside surgeon who performed a nerve injection.  He reports that since then the pain has actually gotten worse.  He also says he has significant back pain.  He had a repeat CT scan that showed an intact hernia repair without evidence of recurrence.  Past Medical History:  Diagnosis Date   Allergic rhinitis due to pollen 04/01/2015   Complication of anesthesia    hard time waking up when I was 53 years old, carpal tunnel surgery   Dyslipidemia 04/22/2015   Essential hypertension, benign 04/01/2015   GERD (gastroesophageal reflux disease)    Hyperglycemia 04/16/2016   Sleep apnea     Past Surgical History:  Procedure Laterality Date   COLONOSCOPY N/A 01/12/2024   Procedure: COLONOSCOPY;  Surgeon: Jinny Carmine, MD;  Location: [facility] ENDOSCOPY;  Service: Endoscopy;  Laterality: N/A;   COLONOSCOPY WITH PROPOFOL  N/A 10/21/2020   Procedure: COLONOSCOPY WITH PROPOFOL ;  Surgeon: Therisa Bi, MD;  Location: [facility] ENDOSCOPY;  Service: Gastroenterology;  Laterality: N/A;   EYE SURGERY     HERNIA REPAIR     multiple   INGUINAL HERNIA REPAIR Right 10/12/2023   Procedure: REPAIR, HERNIA, INGUINAL, ADULT;  Surgeon: Marinda Jayson KIDD, MD;  Location: [facility] ORS;  Service: General;  Laterality: Right;  Possible bilateral   NM RENAL LASIX ([facility] HX)     POLYPECTOMY  01/12/2024   Procedure: POLYPECTOMY,  INTESTINE;  Surgeon: Jinny Carmine, MD;  Location: [facility] ENDOSCOPY;  Service: Endoscopy;;   REFRACTIVE SURGERY     TOE SURGERY Bilateral    big toe   WRIST SURGERY Bilateral    carpal tunnel   XI ROBOTIC ASSISTED INGUINAL HERNIA REPAIR WITH MESH Right 10/12/2023   Procedure: REPAIR, HERNIA, INGUINAL, ROBOT-ASSISTED, LAPAROSCOPIC, USING MESH;  Surgeon: Marinda Jayson KIDD, MD;  Location: [facility] ORS;  Service: General;  Laterality: Right;  Possible bilateral repair Possible open    Family History  Problem Relation Age of Onset   Hypertension Mother    Cancer Mother        cervical   Heart disease Father    Heart attack Father    Hypertension Brother    Hyperlipidemia Brother    Cancer Maternal Grandmother        lymph nodes, spread to lungs    Social History:  reports that he quit smoking about 9 years ago. His smoking use included cigarettes. He started smoking about 19 years ago. He has a 5 pack-year smoking history. He has been exposed to tobacco smoke. He has never used smokeless tobacco. He reports current alcohol use. He reports that he does not use drugs.  Allergies:  Allergies  Allergen Reactions   Codeine Nausea And Vomiting    Medications reviewed.    ROS Full ROS performed and is otherwise negative other than what  is stated in HPI   BP (!) 128/90   Pulse 80   Ht 5' 7.5 (1.715 m)   Wt 178 lb (80.7 kg)   SpO2 100%   BMI 27.47 kg/m   Physical Exam  Alert and oriented x 3, frustrated with the pain and annoyed during exam, abdomen soft, nontender nondistended with incisions that have healed well, right hernia repair intact without any evidence of recurrence on Valsalva.  CT scan performed and reviewed independently by me.  It does not look like there is recurrence of any right inguinal hernia. Assessment/Plan:  Patient with pain after hernia surgery.  He does not have classical neuropathic pain and he actually had worsening pain when he got a injection of local  anesthetic at the area for an ilioinguinal block.  He is quite frustrated with the pain.  I do not see a recurrence on CT scan.  We will try symptomatic pain control treatment.  I will see him back in several weeks.  A total of 30 minutes was spent reviewing the patient's chart and performing of interval history and physical and discussing treatment options with the patient  Jayson Endow, M.D. [facility]

## 2024-06-12 ENCOUNTER — Ambulatory Visit: Admitting: General Surgery

## 2024-06-19 ENCOUNTER — Ambulatory Visit: Payer: Self-pay | Admitting: General Surgery

## 2024-06-19 NOTE — Progress Notes (Signed)
 Surgical Instructions   Your procedure is scheduled on June 26, 2024. Report to [facility] Main Entrance A at 10:40 A.M., then check in with the Admitting office. Any questions or running late day of surgery: call [phone]  Questions prior to your surgery date: call [phone], Monday-Friday, 8am-4pm. If you experience any cold or flu symptoms such as cough, fever, chills, shortness of breath, etc. between now and your scheduled surgery, please notify us  at the above number.     Remember:  Do not eat after midnight the night before your surgery   You may drink clear liquids until 9:40 the morning of your surgery.   Clear liquids allowed are: Water, Non-Citrus Juices (without pulp), Carbonated Beverages, Clear Tea (no milk, honey, etc.), Black Coffee Only (NO MILK, CREAM OR POWDERED CREAMER of any kind), and Gatorade. Patient Instructions  The night before surgery:  No food after midnight. ONLY clear liquids after midnight  The day of surgery (if you do NOT have diabetes):  Drink ONE (1) Pre-Surgery Clear Ensure by 9:40 the morning of surgery. Drink in one sitting. Do not sip.  This drink was given to you during your hospital  pre-op appointment visit.  Nothing else to drink after completing the  Pre-Surgery Clear Ensure.          If you have questions, please contact your surgeon's office.    Take these medicines the morning of surgery with A SIP OF WATER  cetirizine (ZYRTEC)  omeprazole  (PRILOSEC)   May take these medicines IF NEEDED: famotidine (PEPCID)  traMADol  (ULTRAM )    tirzepatide (ZEPBOUND) - LAST DOSE  One week prior to surgery, STOP taking any Aspirin  (unless otherwise instructed by your surgeon) Aleve, Naproxen, Ibuprofen, Motrin, Advil, Goody's, BC's, all herbal medications, fish oil, and non-prescription vitamins.                     Do NOT Smoke (Tobacco/Vaping) for 24 hours prior to your procedure.  If you use a CPAP at night, you may  bring your mask/headgear for your overnight stay.   You will be asked to remove any contacts, glasses, piercing's, hearing aid's, dentures/partials prior to surgery. Please bring cases for these items if needed.    Patients discharged the day of surgery will not be allowed to drive home, and someone needs to stay with them for 24 hours.  SURGICAL WAITING ROOM VISITATION Patients may have no more than 2 support people in the waiting area - these visitors may rotate.   Pre-op nurse will coordinate an appropriate time for 1 ADULT support person, who may not rotate, to accompany patient in pre-op.  Children under the age of 87 must have an adult with them who is not the patient and must remain in the main waiting area with an adult.  If the patient needs to stay at the hospital during part of their recovery, the visitor guidelines for inpatient rooms apply.  Please refer to the [facility] website for the visitor guidelines for any additional information.   If you received a COVID test during your pre-op visit  it is requested that you wear a mask when out in public, stay away from anyone that may not be feeling well and notify your surgeon if you develop symptoms. If you have been in contact with anyone that has tested positive in the last 10 days please notify you surgeon.      Pre-operative CHG Bathing Instructions   You can play  a key role in reducing the risk of infection after surgery. Your skin needs to be as free of germs as possible. You can reduce the number of germs on your skin by washing with CHG (chlorhexidine  gluconate) soap before surgery. CHG is an antiseptic soap that kills germs and continues to kill germs even after washing.   DO NOT use if you have an allergy to chlorhexidine /CHG or antibacterial soaps. If your skin becomes reddened or irritated, stop using the CHG and notify one of our RNs at [phone].              TAKE A SHOWER THE NIGHT BEFORE SURGERY   Please  keep in mind the following:  DO NOT shave, including legs and underarms, 48 hours prior to surgery.   You may shave your face before/day of surgery.  Place clean sheets on your bed the night before surgery Use a clean washcloth (not used since being washed) for shower. DO NOT sleep with pet's night before surgery.  CHG Shower Instructions:  Wash your face and private area with normal soap. If you choose to wash your hair, wash first with your normal shampoo.  After you use shampoo/soap, rinse your hair and body thoroughly to remove shampoo/soap residue.  Turn the water OFF and apply half the bottle of CHG soap to a CLEAN washcloth.  Apply CHG soap ONLY FROM YOUR NECK DOWN TO YOUR TOES (washing for 3-5 minutes)  DO NOT use CHG soap on face, private areas, open wounds, or sores.  Pay special attention to the area where your surgery is being performed.  If you are having back surgery, having someone wash your back for you may be helpful. Wait 2 minutes after CHG soap is applied, then you may rinse off the CHG soap.  Pat dry with a clean towel  Put on clean pajamas    Additional instructions for the day of surgery: If you choose, you may shower the morning of surgery with an antibacterial soap.  DO NOT APPLY any lotions, deodorants, cologne, or perfumes.   Do not wear jewelry or makeup Do not wear nail polish, gel polish, artificial nails, or any other type of covering on natural nails (fingers and toes) Do not bring valuables to the hospital.  [facility] is not responsible for valuables/personal belongings. Put on clean/comfortable clothes.  Please brush your teeth.  Ask your nurse before applying any prescription medications to the skin.

## 2024-06-20 ENCOUNTER — Encounter (HOSPITAL_COMMUNITY): Payer: Self-pay

## 2024-06-20 ENCOUNTER — Encounter (HOSPITAL_COMMUNITY)
Admission: RE | Admit: 2024-06-20 | Discharge: 2024-06-20 | Disposition: A | Discharge status: Home / Self Care | Source: Ambulatory Visit | Attending: General Surgery | Admitting: General Surgery

## 2024-06-20 ENCOUNTER — Other Ambulatory Visit: Payer: Self-pay

## 2024-06-20 VITALS — BP 123/88 | HR 76 | Temp 97.8°F | Resp 18 | Ht 68.0 in | Wt 174.7 lb

## 2024-06-20 DIAGNOSIS — K4090 Unilateral inguinal hernia, without obstruction or gangrene, not specified as recurrent: Secondary | ICD-10-CM | POA: Insufficient documentation

## 2024-06-20 DIAGNOSIS — G4733 Obstructive sleep apnea (adult) (pediatric): Secondary | ICD-10-CM | POA: Diagnosis not present

## 2024-06-20 DIAGNOSIS — E785 Hyperlipidemia, unspecified: Secondary | ICD-10-CM | POA: Diagnosis not present

## 2024-06-20 DIAGNOSIS — Z87891 Personal history of nicotine dependence: Secondary | ICD-10-CM | POA: Insufficient documentation

## 2024-06-20 DIAGNOSIS — K219 Gastro-esophageal reflux disease without esophagitis: Secondary | ICD-10-CM | POA: Diagnosis not present

## 2024-06-20 DIAGNOSIS — I10 Essential (primary) hypertension: Secondary | ICD-10-CM | POA: Insufficient documentation

## 2024-06-20 DIAGNOSIS — Z01812 Encounter for preprocedural laboratory examination: Secondary | ICD-10-CM | POA: Insufficient documentation

## 2024-06-20 DIAGNOSIS — Z01818 Encounter for other preprocedural examination: Secondary | ICD-10-CM

## 2024-06-20 LAB — CBC
HCT: 45.3 % (ref 39.0–52.0)
Hemoglobin: 16.4 g/dL (ref 13.0–17.0)
MCH: 31.7 pg (ref 26.0–34.0)
MCHC: 36.2 g/dL — ABNORMAL HIGH (ref 30.0–36.0)
MCV: 87.6 fL (ref 80.0–100.0)
Platelets: 501 K/uL — ABNORMAL HIGH (ref 150–400)
RBC: 5.17 MIL/uL (ref 4.22–5.81)
RDW: 12.5 % (ref 11.5–15.5)
WBC: 6.3 K/uL (ref 4.0–10.5)
nRBC: 0 % (ref 0.0–0.2)

## 2024-06-20 LAB — BASIC METABOLIC PANEL WITH GFR
Anion gap: 14 (ref 5–15)
BUN: 15 mg/dL (ref 6–20)
CO2: 24 mmol/L (ref 22–32)
Calcium: 9.9 mg/dL (ref 8.9–10.3)
Chloride: 99 mmol/L (ref 98–111)
Creatinine, Ser: 0.96 mg/dL (ref 0.61–1.24)
GFR, Estimated: >60 mL/min (ref >60)
Glucose, Bld: 97 mg/dL (ref 70–99)
Potassium: 3.4 mmol/L — ABNORMAL LOW (ref 3.5–5.1)
Sodium: 137 mmol/L (ref 135–145)

## 2024-06-20 NOTE — Progress Notes (Addendum)
 PCP - Channing Schaffer Preferred Primary Care Cardiologist -   PPM/ICD - denies Device Orders - n/a Rep Notified - n/a  Chest x-ray - denies EKG - 10-11-23 ( requested  0 Stress Test - denies ECHO - denies Cardiac Cath - denies  Sleep Study - denies CPAP - n/a  DM- denies  Last dose of GLP1 agonist-  tirzepatide (ZEPBOUND)  GLP1 instructions:  last dose 06-16-24. Patient instructed to hold next on Saturday  Blood Thinner Instructions:denies Aspirin  Instructions:  ERAS Protcol - clear liquids until 9:40 am. PRE-SURGERY Ensure    COVID TEST- n/a   Anesthesia review: Yes Hx of HTN. Patient reports K+ level of 3.0  last week voiced concerns.  Patient denies shortness of breath, fever, cough and chest pain at PAT appointment   All instructions explained to the patient, with a verbal understanding of the material. Patient agrees to go over the instructions while at home for a better understanding. Patient also instructed to self quarantine after being tested for COVID-19. The opportunity to ask questions was provided.

## 2024-06-21 NOTE — Anesthesia Preprocedure Evaluation (Addendum)
 Anesthesia Evaluation  Patient identified by MRN, date of birth, ID band  Reviewed: Allergy & Precautions, NPO status , Patient's Chart, lab work & pertinent test results  Airway Mallampati: III  TM Distance: <3 FB Neck ROM: Full    Dental  (+) Teeth Intact, Dental Advisory Given   Pulmonary sleep apnea , former smoker   breath sounds clear to auscultation       Cardiovascular hypertension, Pt. on medications  Rhythm:Regular Rate:Normal     Neuro/Psych negative neurological ROS  negative psych ROS   GI/Hepatic Neg liver ROS,GERD  Medicated,,  Endo/Other  negative endocrine ROS    Renal/GU negative Renal ROS     Musculoskeletal negative musculoskeletal ROS (+)    Abdominal   Peds  Hematology negative hematology ROS (+)   Anesthesia Other Findings   Reproductive/Obstetrics                              Anesthesia Physical Anesthesia Plan  ASA: 2  Anesthesia Plan: General   Post-op Pain Management: Tylenol  PO (pre-op)* and Toradol  IV (intra-op)*   Induction: Intravenous  PONV Risk Score and Plan: 3 and Ondansetron , Dexamethasone  and Midazolam   Airway Management Planned: Oral ETT  Additional Equipment: None  Intra-op Plan:   Post-operative Plan: Extubation in OR  Informed Consent: I have reviewed the patients History and Physical, chart, labs and discussed the procedure including the risks, benefits and alternatives for the proposed anesthesia with the patient or authorized representative who has indicated his/her understanding and acceptance.     Dental advisory given  Plan Discussed with: CRNA  Anesthesia Plan Comments: (PAT note written 06/21/2024 by Isaiah Ruder, PA-C.  )         Anesthesia Quick Evaluation

## 2024-06-21 NOTE — Progress Notes (Signed)
 Anesthesia Chart Review:  Case: [id number] Date/Time: 06/26/24 1226   Procedure: REPAIR, HERNIA, INGUINAL, ADULT (Right)   Anesthesia type: General   Pre-op diagnosis: RIGHT INGUINAL HERNIA   [facility] ROOM 10 / [facility] OR   Surgeons: Rubin Calamity, MD       DISCUSSION: Patient is a 53 year old male scheduled for the above procedure.  History includes former smoker (quit 2016), HTN, dyslipidemia, GERD, OSA, inguinal hernia (s/p robotic assisted recurrent right inguinal hernia repair, creation of right posterior rectus-transversalis fascia advancement flap for coverage of pelvis wound 10/12/2022). Reported prolonged emergence after carpal tunnel surgery at age 25 years old.  Last Zepbound 06/16/2024.  Labs on 06/20/2024 showed K 3.4, Cr 0.96, glucose 97, H/H 16.4/45.3, PLT 501.  Anesthesia team to evaluate on the day of surgery.  VS: BP 123/88   Pulse 76   Temp 36.6 C (Oral)   Resp 18   Ht 5' 8 (1.727 m)   Wt 79.2 kg   SpO2 100%   BMI 26.56 kg/m   PROVIDERS: Donal Channing SQUIBB, FNP is PCP    LABS: Labs reviewed: Acceptable for surgery. (all labs ordered are listed, but only abnormal results are displayed)  Labs Reviewed  BASIC METABOLIC PANEL WITH GFR - Abnormal; Notable for the following components:      Result Value   Potassium 3.4 (*)    All other components within normal limits  CBC - Abnormal; Notable for the following components:   MCHC 36.2 (*)    Platelets 501 (*)    All other components within normal limits     IMAGES: CT Abd/pelvis 03/27/2024 ([facility]): IMPRESSION: Small fat-containing left direct inguinal hernia.    EKG: 10/11/2023: NSR   CV: N/A  Past Medical History:  Diagnosis Date   Allergic rhinitis due to pollen 04/01/2015   Complication of anesthesia    hard time waking up when I was 53 years old, carpal tunnel surgery   Dyslipidemia 04/22/2015   Essential hypertension, benign 04/01/2015   GERD (gastroesophageal reflux disease)     Hyperglycemia 04/16/2016   Sleep apnea     Past Surgical History:  Procedure Laterality Date   COLONOSCOPY N/A 01/12/2024   Procedure: COLONOSCOPY;  Surgeon: Jinny Carmine, MD;  Location: [facility] ENDOSCOPY;  Service: Endoscopy;  Laterality: N/A;   COLONOSCOPY WITH PROPOFOL  N/A 10/21/2020   Procedure: COLONOSCOPY WITH PROPOFOL ;  Surgeon: Therisa Bi, MD;  Location: [facility] ENDOSCOPY;  Service: Gastroenterology;  Laterality: N/A;   EYE SURGERY     HERNIA REPAIR     multiple   INGUINAL HERNIA REPAIR Right 10/12/2023   Procedure: REPAIR, HERNIA, INGUINAL, ADULT;  Surgeon: Marinda Jayson KIDD, MD;  Location: [facility] ORS;  Service: General;  Laterality: Right;  Possible bilateral   NM RENAL LASIX ([facility] HX)     POLYPECTOMY  01/12/2024   Procedure: POLYPECTOMY, INTESTINE;  Surgeon: Jinny Carmine, MD;  Location: [facility] ENDOSCOPY;  Service: Endoscopy;;   REFRACTIVE SURGERY     TOE SURGERY Bilateral    big toe   WRIST SURGERY Bilateral    carpal tunnel   XI ROBOTIC ASSISTED INGUINAL HERNIA REPAIR WITH MESH Right 10/12/2023   Procedure: REPAIR, HERNIA, INGUINAL, ROBOT-ASSISTED, LAPAROSCOPIC, USING MESH;  Surgeon: Marinda Jayson KIDD, MD;  Location: [facility] ORS;  Service: General;  Laterality: Right;  Possible bilateral repair Possible open    MEDICATIONS:  atorvastatin  (LIPITOR) 40 MG tablet   cetirizine (ZYRTEC) 10 MG tablet   chlorthalidone (HYGROTON) 50 MG tablet   cholecalciferol (  VITAMIN D3) 25 MCG (1000 UNIT) tablet   famotidine (PEPCID) 20 MG tablet   gabapentin (NEURONTIN) 300 MG capsule   lidocaine  (LIDODERM ) 5 %   lisinopril (ZESTRIL) 20 MG tablet   omeprazole  (PRILOSEC) 20 MG capsule   tirzepatide (ZEPBOUND) 10 MG/0.5ML Pen   traMADol  (ULTRAM ) 50 MG tablet   No current facility-administered medications for this encounter.    Isaiah Ruder, PA-C Surgical Short Stay/Anesthesiology [facility] Phone [phone] [facility] Phone [phone] 06/21/2024 3:21 PM

## 2024-06-25 ENCOUNTER — Encounter (HOSPITAL_COMMUNITY): Payer: Self-pay | Admitting: General Surgery

## 2024-06-25 NOTE — H&P (Signed)
 History of Present Illness: Andrew Bond is a 53 y.o. male who is seen today as an office consultation at the request of Dr. Burnetta for evaluation of Follow-up .   History of Present Illness Andrew Bond is a 53 year old male who presents with persistent right-sided abdominal pain and a bulge.   Right abdominal bulge and pain - Persistent bulge on the right side of the abdomen - Pain increases with pressure to the area - No new symptoms on the right side other than the bulge and pain - History of previous hernia surgery with nerve injury - Imaging studies have been performed previously   Post-injection pain - Severe pain following recent injection, described as 'everybody was on fire' - Pain lasted for one week after injection - Reluctant to undergo similar treatments again   Neuropathic and musculoskeletal pain management - Gabapentin 300 mg at night for sciatica-related symptoms - Cautious about increasing gabapentin dose due to potential side effects - Uses tramadol  for pain relief   Occupational impact - 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  I have reviewed this information and discussed as appropriate with the patient.  See HPI as well for other ROS.   Review of Systems  Constitutional:  Negative for fever.  HENT:  Negative for congestion.   Eyes:  Negative for blurred vision.  Respiratory:  Negative for cough, shortness of breath and wheezing.   Cardiovascular:  Negative for chest pain and palpitations.  Gastrointestinal:  Negative for heartburn.  Genitourinary:  Negative for dysuria.  Musculoskeletal:  Negative for myalgias.  Skin:  Negative for rash.  Neurological:  Negative for dizziness and headaches.  Psychiatric/Behavioral:  Negative for depression and suicidal ideas.   All other  systems reviewed and are negative.       Medical History: Past Medical History Past Medical History: Diagnosis Date  Hyperlipidemia    Hypertension         Problem List There is no problem list on file for this patient.     Past Surgical History Past Surgical History: Procedure Laterality Date  HERNIA REPAIR          Allergies Allergies Allergen Reactions  Codeine Nausea      Medications Ordered Prior to Encounter Current Outpatient Medications on File Prior to Visit Medication Sig Dispense Refill  atorvastatin  (LIPITOR) 40 MG tablet Take 40 mg by mouth once daily      chlorthalidone 50 MG tablet Take 50 mg by mouth once daily      famotidine (PEPCID) 20 MG tablet Take 20 mg by mouth 2 (two) times daily      gabapentin (NEURONTIN) 300 MG capsule Take 300 mg by mouth once daily      losartan  (COZAAR ) 50 MG tablet Take 50 mg by mouth once daily      omeprazole  (PRILOSEC) 10 MG DR capsule Take 10 mg by mouth once daily      pantoprazole (PROTONIX) 40 MG DR tablet Take 40 mg by mouth once daily      ZEPBOUND 10 mg/0.5 mL pen injector INJECT 10MG  SUBCUTANEOUSLY EVERY WEEK      atorvastatin  (LIPITOR) 40 MG tablet Take 40 mg by mouth once daily      cetirizine (ZYRTEC) 10 MG tablet Take 10 mg by mouth once daily  cyclobenzaprine (FLEXERIL) 5 MG tablet TAKE 1 TABLET BY MOUTH THREE TIMES DAILY AS NEEDED FOR 14 DAYS      lisinopriL (ZESTRIL) 20 MG tablet Take 40 mg by mouth once daily      mometasone (ELOCON) 0.1 % cream APPLY ONE APPLICATION TOPICALLY TO AFFECTED AREA DAILY AS NEEDED FOR SKIN IRRITATION      montelukast  (SINGULAIR ) 10 mg tablet Take 10 mg by mouth at bedtime        No current facility-administered medications on file prior to visit.      Family History Family History Problem Relation Age of Onset  High blood pressure (Hypertension) Mother    Hyperlipidemia (Elevated cholesterol) Mother    Allergic rhinitis Brother    Diabetes type II Neg Hx     Coronary Artery Disease (Blocked arteries around heart) Neg Hx        Tobacco Use History Social History    Tobacco Use Smoking Status Former Smokeless Tobacco Never      Social History Social History    Socioeconomic History  Marital status: Married Tobacco Use  Smoking status: Former  Smokeless tobacco: Never Advertising Account Planner  Vaping status: Never Used Substance and Sexual Activity  Alcohol use: Not Currently  Drug use: Never  Sexual activity: Yes Social History Archivist at [employer]   [employer] soon   Divorced    Social Drivers of Health    Housing Stability: Unknown (04/13/2024)   Housing Stability Vital Sign    Homeless in the Last Year: No      Objective:     There were no vitals filed for this visit.  There is no height or weight on file to calculate BMI. Physical Exam Constitutional:      General: He is not in acute distress.    Appearance: Normal appearance.  HENT:     Head: Normocephalic.     Nose: No rhinorrhea.     Mouth/Throat:     Mouth: Mucous membranes are moist.     Pharynx: Oropharynx is clear.  Eyes:     General: No scleral icterus.    Pupils: Pupils are equal, round, and reactive to light.  Cardiovascular:     Rate and Rhythm: Normal rate.     Pulses: Normal pulses.  Pulmonary:     Effort: Pulmonary effort is normal. No respiratory distress.     Breath sounds: No stridor. No wheezing. Rales: bulge to right inguinal area. Abdominal:     General: Abdomen is flat. There is no distension.     Tenderness: There is no abdominal tenderness. There is no guarding or rebound.  Musculoskeletal:        General: Normal range of motion.     Cervical back: Normal range of motion and neck supple.  Skin:    General: Skin is warm and dry.     Capillary Refill: Capillary refill takes less than 2 seconds.     Coloration: Skin is not jaundiced.  Neurological:     General: No focal deficit present.     Mental Status: He is  alert and oriented to person, place, and time. Mental status is at baseline.  Psychiatric:        Mood and Affect: Mood normal.        Thought Content: Thought content normal.        Judgment: Judgment normal.         Assessment and Plan: Diagnoses and all orders for this visit:  Inguinodynia, right     Andrew Bond is a 53 y.o. male    1.  We will proceed to the OR for a OPEN RIGHT inguinal hernia repair with mesh and nerve ligation 2. All risks and benefits were discussed with the patient, to generally include infection, bleeding, damage to surrounding structures, acute and chronic nerve pain, and recurrence. Alternatives were offered and described.  All questions were answered and the patient voiced understanding of the procedure and wishes to proceed at this point.             No follow-ups on file.   Lynda Leos, MD, [facility] Surgery, [place] [facility] Minimally Invasive Surgery

## 2024-06-26 ENCOUNTER — Ambulatory Visit (HOSPITAL_COMMUNITY)
Admission: RE | Admit: 2024-06-26 | Discharge: 2024-06-26 | Disposition: A | Discharge status: Home / Self Care | Source: Ambulatory Visit | Attending: General Surgery | Admitting: General Surgery

## 2024-06-26 ENCOUNTER — Ambulatory Visit (HOSPITAL_COMMUNITY): Payer: Self-pay | Admitting: Vascular Surgery

## 2024-06-26 ENCOUNTER — Ambulatory Visit (HOSPITAL_COMMUNITY): Admitting: Anesthesiology

## 2024-06-26 ENCOUNTER — Encounter (HOSPITAL_COMMUNITY)
Admission: RE | Disposition: A | Discharge status: Home / Self Care | Payer: Self-pay | Source: Ambulatory Visit | Attending: General Surgery

## 2024-06-26 ENCOUNTER — Other Ambulatory Visit (HOSPITAL_COMMUNITY): Payer: Self-pay

## 2024-06-26 DIAGNOSIS — K4091 Unilateral inguinal hernia, without obstruction or gangrene, recurrent: Secondary | ICD-10-CM | POA: Diagnosis present

## 2024-06-26 DIAGNOSIS — G5781 Other specified mononeuropathies of right lower limb: Secondary | ICD-10-CM | POA: Insufficient documentation

## 2024-06-26 DIAGNOSIS — G4730 Sleep apnea, unspecified: Secondary | ICD-10-CM | POA: Insufficient documentation

## 2024-06-26 DIAGNOSIS — K4090 Unilateral inguinal hernia, without obstruction or gangrene, not specified as recurrent: Secondary | ICD-10-CM | POA: Diagnosis not present

## 2024-06-26 DIAGNOSIS — K219 Gastro-esophageal reflux disease without esophagitis: Secondary | ICD-10-CM | POA: Insufficient documentation

## 2024-06-26 DIAGNOSIS — I10 Essential (primary) hypertension: Secondary | ICD-10-CM | POA: Insufficient documentation

## 2024-06-26 DIAGNOSIS — Z87891 Personal history of nicotine dependence: Secondary | ICD-10-CM | POA: Insufficient documentation

## 2024-06-26 HISTORY — PX: INGUINAL HERNIA REPAIR: SHX194

## 2024-06-26 SURGERY — REPAIR, HERNIA, INGUINAL, ADULT
Anesthesia: General | Laterality: Right

## 2024-06-26 MED ORDER — ACETAMINOPHEN 10 MG/ML IV SOLN: 1000.0000 mg | Freq: Once | INTRAVENOUS | Status: DC | PRN

## 2024-06-26 MED ORDER — ENSURE PRE-SURGERY PO LIQD: 296.0000 mL | Freq: Once | ORAL | Status: DC

## 2024-06-26 MED ORDER — PHENYLEPHRINE 80 MCG/ML (10ML) SYRINGE FOR IV PUSH (FOR BLOOD PRESSURE SUPPORT)
PREFILLED_SYRINGE | INTRAVENOUS | Status: DC | PRN
Start: 2024-06-26 — End: 2024-06-26
  Administered 2024-06-26: 160 ug via INTRAVENOUS

## 2024-06-26 MED ORDER — ROCURONIUM BROMIDE 10 MG/ML (PF) SYRINGE
PREFILLED_SYRINGE | INTRAVENOUS | Status: DC | PRN
  Administered 2024-06-26: 50 mg via INTRAVENOUS

## 2024-06-26 MED ORDER — CHLORHEXIDINE GLUCONATE CLOTH 2 % EX PADS
6.0000 | MEDICATED_PAD | Freq: Once | CUTANEOUS | Status: AC
  Administered 2024-06-26: 6 via TOPICAL

## 2024-06-26 MED ORDER — MIDAZOLAM HCL (PF) 2 MG/2ML IJ SOLN
INTRAMUSCULAR | Status: DC | PRN
  Administered 2024-06-26: 2 mg via INTRAVENOUS

## 2024-06-26 MED ORDER — DEXAMETHASONE SOD PHOSPHATE PF 10 MG/ML IJ SOLN
INTRAMUSCULAR | Status: DC | PRN
  Administered 2024-06-26: 10 mg via INTRAVENOUS

## 2024-06-26 MED ORDER — LACTATED RINGERS IV SOLN: INTRAVENOUS | Status: DC

## 2024-06-26 MED ORDER — LIDOCAINE 2% (20 MG/ML) 5 ML SYRINGE
INTRAMUSCULAR | Status: DC | PRN
  Administered 2024-06-26: 50 mg via INTRAVENOUS

## 2024-06-26 MED ORDER — OXYCODONE HCL 5 MG PO TABS
ORAL_TABLET | ORAL | Status: AC
  Filled 2024-06-26: qty 1

## 2024-06-26 MED ORDER — BUPIVACAINE-EPINEPHRINE (PF) 0.25% -1:200000 IJ SOLN
INTRAMUSCULAR | Status: AC
Start: 2024-06-26 — End: 2024-06-26
  Filled 2024-06-26: qty 30

## 2024-06-26 MED ORDER — FENTANYL CITRATE (PF) 100 MCG/2ML IJ SOLN
INTRAMUSCULAR | Status: AC
  Filled 2024-06-26: qty 2

## 2024-06-26 MED ORDER — ORAL CARE MOUTH RINSE: 15.0000 mL | Freq: Once | OROMUCOSAL | Status: AC

## 2024-06-26 MED ORDER — OXYCODONE HCL 5 MG PO TABS
5.0000 mg | ORAL_TABLET | Freq: Once | ORAL | Status: AC | PRN
  Administered 2024-06-26: 5 mg via ORAL

## 2024-06-26 MED ORDER — KETOROLAC TROMETHAMINE 30 MG/ML IJ SOLN
INTRAMUSCULAR | Status: AC
  Filled 2024-06-26: qty 1

## 2024-06-26 MED ORDER — 0.9 % SODIUM CHLORIDE (POUR BTL) OPTIME
TOPICAL | Status: DC | PRN
  Administered 2024-06-26: 1000 mL

## 2024-06-26 MED ORDER — KETOROLAC TROMETHAMINE 30 MG/ML IJ SOLN
INTRAMUSCULAR | Status: DC | PRN
  Administered 2024-06-26: 15 mg via INTRAVENOUS

## 2024-06-26 MED ORDER — FENTANYL CITRATE (PF) 250 MCG/5ML IJ SOLN
INTRAMUSCULAR | Status: DC | PRN
Start: 2024-06-26 — End: 2024-06-26
  Administered 2024-06-26: 50 ug via INTRAVENOUS

## 2024-06-26 MED ORDER — OXYCODONE-ACETAMINOPHEN 5-325 MG PO TABS
1.0000 | ORAL_TABLET | ORAL | 0 refills | Status: AC | PRN
  Filled 2024-06-26: qty 20, 4d supply, fill #0

## 2024-06-26 MED ORDER — SUGAMMADEX SODIUM 200 MG/2ML IV SOLN
INTRAVENOUS | Status: DC | PRN
  Administered 2024-06-26: 200 mg via INTRAVENOUS

## 2024-06-26 MED ORDER — CHLORHEXIDINE GLUCONATE CLOTH 2 % EX PADS: 6.0000 | MEDICATED_PAD | Freq: Once | CUTANEOUS | Status: DC

## 2024-06-26 MED ORDER — OXYCODONE HCL 5 MG/5ML PO SOLN: 5.0000 mg | Freq: Once | ORAL | Status: AC | PRN

## 2024-06-26 MED ORDER — PROPOFOL 10 MG/ML IV BOLUS
INTRAVENOUS | Status: DC | PRN
  Administered 2024-06-26: 140 mg via INTRAVENOUS

## 2024-06-26 MED ORDER — BUPIVACAINE-EPINEPHRINE 0.25% -1:200000 IJ SOLN
INTRAMUSCULAR | Status: DC | PRN
  Administered 2024-06-26: 20 mL

## 2024-06-26 MED ORDER — FENTANYL CITRATE (PF) 100 MCG/2ML IJ SOLN
25.0000 ug | INTRAMUSCULAR | Status: DC | PRN
  Administered 2024-06-26 (×3): 50 ug via INTRAVENOUS

## 2024-06-26 MED ORDER — CHLORHEXIDINE GLUCONATE 0.12 % MT SOLN
15.0000 mL | Freq: Once | OROMUCOSAL | Status: AC
  Administered 2024-06-26: 15 mL via OROMUCOSAL
  Filled 2024-06-26: qty 15

## 2024-06-26 MED ORDER — DROPERIDOL 2.5 MG/ML IJ SOLN: 0.6250 mg | Freq: Once | INTRAMUSCULAR | Status: DC | PRN

## 2024-06-26 MED ORDER — BUPIVACAINE-EPINEPHRINE (PF) 0.25% -1:200000 IJ SOLN
INTRAMUSCULAR | Status: AC
  Filled 2024-06-26: qty 30

## 2024-06-26 MED ORDER — ACETAMINOPHEN 500 MG PO TABS
1000.0000 mg | ORAL_TABLET | ORAL | Status: AC
  Administered 2024-06-26: 1000 mg via ORAL
  Filled 2024-06-26: qty 2

## 2024-06-26 MED ORDER — MIDAZOLAM HCL 2 MG/2ML IJ SOLN
INTRAMUSCULAR | Status: AC
  Filled 2024-06-26: qty 2

## 2024-06-26 MED ORDER — ONDANSETRON HCL 4 MG/2ML IJ SOLN
INTRAMUSCULAR | Status: DC | PRN
  Administered 2024-06-26: 4 mg via INTRAVENOUS

## 2024-06-26 MED ORDER — CEFAZOLIN SODIUM-DEXTROSE 2-4 GM/100ML-% IV SOLN
2.0000 g | INTRAVENOUS | Status: AC
  Administered 2024-06-26: 2 g via INTRAVENOUS
  Filled 2024-06-26: qty 100

## 2024-06-26 SURGICAL SUPPLY — 34 items
BAG COUNTER SPONGE SURGICOUNT (BAG) ×1 IMPLANT
BLADE CLIPPER SURG (BLADE) IMPLANT
CANISTER SUCTION 3000ML PPV (SUCTIONS) IMPLANT
CHLORAPREP W/TINT 26 (MISCELLANEOUS) ×1 IMPLANT
COVER SURGICAL LIGHT HANDLE (MISCELLANEOUS) ×1 IMPLANT
DERMABOND ADVANCED .7 DNX12 (GAUZE/BANDAGES/DRESSINGS) ×1 IMPLANT
DRAIN PENROSE .5X12 LATEX STL (DRAIN) IMPLANT
DRAPE LAPAROSCOPIC ABDOMINAL (DRAPES) ×1 IMPLANT
ELECTRODE REM PT RTRN 9FT ADLT (ELECTROSURGICAL) ×1 IMPLANT
GAUZE 4X4 16PLY ~~LOC~~+RFID DBL (SPONGE) ×1 IMPLANT
GLOVE BIO SURGEON STRL SZ7.5 (GLOVE) ×2 IMPLANT
GLOVE BIOGEL PI IND STRL 8 (GLOVE) ×1 IMPLANT
GOWN STRL REUS W/ TWL LRG LVL3 (GOWN DISPOSABLE) ×1 IMPLANT
GOWN STRL REUS W/ TWL XL LVL3 (GOWN DISPOSABLE) ×1 IMPLANT
KIT BASIN OR (CUSTOM PROCEDURE TRAY) ×1 IMPLANT
KIT TURNOVER KIT B (KITS) ×1 IMPLANT
MESH PARIETEX PROGRIP RIGHT (Mesh General) IMPLANT
NDL HYPO 25GX1X1/2 BEV (NEEDLE) ×1 IMPLANT
PACK GENERAL/GYN (CUSTOM PROCEDURE TRAY) ×1 IMPLANT
PAD ARMBOARD POSITIONER FOAM (MISCELLANEOUS) ×2 IMPLANT
PENCIL SMOKE EVACUATOR (MISCELLANEOUS) ×1 IMPLANT
SOLN 0.9% NACL POUR BTL 1000ML (IV SOLUTION) ×1 IMPLANT
SPONGE INTESTINAL PEANUT (DISPOSABLE) IMPLANT
SUT MNCRL AB 4-0 PS2 18 (SUTURE) ×1 IMPLANT
SUT PROLENE 2 0 SH DA (SUTURE) ×1 IMPLANT
SUT SILK 0 TIES 10X30 (SUTURE) ×1 IMPLANT
SUT VIC AB 2-0 SH 27X BRD (SUTURE) ×1 IMPLANT
SUT VIC AB 3-0 SH 27XBRD (SUTURE) ×1 IMPLANT
SUT VICRYL AB 2 0 TIES (SUTURE) ×1 IMPLANT
SYR CONTROL 10ML LL (SYRINGE) ×1 IMPLANT
SYRINGE TOOMEY DISP (SYRINGE) ×1 IMPLANT
TOWEL GREEN STERILE (TOWEL DISPOSABLE) ×1 IMPLANT
TOWEL GREEN STERILE FF (TOWEL DISPOSABLE) ×1 IMPLANT
TRAY FOL W/BAG SLVR 16FR STRL (SET/KITS/TRAYS/PACK) IMPLANT

## 2024-06-26 NOTE — Anesthesia Postprocedure Evaluation (Signed)
 Anesthesia Post Note  Patient: Andrew Bond  Procedure(s) Performed: REPAIR, HERNIA, INGUINAL, ADULT (Right)     Patient location during evaluation: PACU Anesthesia Type: General Level of consciousness: awake and alert Pain management: pain level controlled Vital Signs Assessment: post-procedure vital signs reviewed and stable Respiratory status: spontaneous breathing, nonlabored ventilation, respiratory function stable and patient connected to nasal cannula oxygen Cardiovascular status: blood pressure returned to baseline and stable Postop Assessment: no apparent nausea or vomiting Anesthetic complications: no   No notable events documented.  Last Vitals:  Vitals:   06/26/24 1315 06/26/24 1330  BP: (!) 124/91 (!) 127/93  Pulse: 74 69  Resp: 20 15  Temp:  37 C  SpO2: 99% 100%    Last Pain:  Vitals:   06/26/24 1330  TempSrc:   PainSc: 3                  Franky JONETTA Bald

## 2024-06-26 NOTE — Anesthesia Procedure Notes (Signed)
 Procedure Name: Intubation Date/Time: 06/26/2024 11:30 AM  Performed by: Erlene Powell POUR, CRNAPre-anesthesia Checklist: Patient identified, Emergency Drugs available, Suction available and Patient being monitored Patient Re-evaluated:Patient Re-evaluated prior to induction Oxygen Delivery Method: Circle system utilized Preoxygenation: Pre-oxygenation with 100% oxygen Induction Type: IV induction Ventilation: Mask ventilation without difficulty Laryngoscope Size: Miller and 2 Grade View: Grade I Tube type: Oral Tube size: 7.5 mm Number of attempts: 1 Airway Equipment and Method: Stylet Placement Confirmation: ETT inserted through vocal cords under direct vision, positive ETCO2 and breath sounds checked- equal and bilateral Secured at: 23 cm Tube secured with: Tape Dental Injury: Teeth and Oropharynx as per pre-operative assessment

## 2024-06-26 NOTE — Discharge Instructions (Signed)

## 2024-06-26 NOTE — Interval H&P Note (Signed)
 History and Physical Interval Note:  06/26/2024 10:44 AM  Andrew Bond  has presented today for surgery, with the diagnosis of RIGHT INGUINAL HERNIA.  The various methods of treatment have been discussed with the patient and family. After consideration of risks, benefits and other options for treatment, the patient has consented to  Procedure(s): REPAIR, HERNIA, INGUINAL, ADULT (Right) as a surgical intervention.  The patient's history has been reviewed, patient examined, no change in status, stable for surgery.  I have reviewed the patient's chart and labs.  Questions were answered to the patient's satisfaction.     [name]

## 2024-06-26 NOTE — Transfer of Care (Signed)
 Immediate Anesthesia Transfer of Care Note  Patient: Andrew Bond  Procedure(s) Performed: REPAIR, HERNIA, INGUINAL, ADULT (Right)  Patient Location: PACU  Anesthesia Type:General  Level of Consciousness: awake and patient cooperative  Airway & Oxygen Therapy: Patient Spontanous Breathing and Patient connected to face mask oxygen  Post-op Assessment: Report given to RN, Post -op Vital signs reviewed and stable, and Patient moving all extremities  Post vital signs: Reviewed and stable  Last Vitals:  Vitals Value Taken Time  BP 131/82 06/26/24 12:45  Temp    Pulse 70 06/26/24 12:50  Resp 24 06/26/24 12:50  SpO2 100 % 06/26/24 12:50  Vitals shown include unfiled device data.  Last Pain:  Vitals:   06/26/24 1109  TempSrc: Oral  PainSc:          Complications: No notable events documented.

## 2024-06-26 NOTE — Op Note (Signed)
 06/26/2024  12:31 PM  PATIENT:  Andrew Bond  53 y.o. 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 Margretta Brash PGY-3    ANESTHESIA:   local and general  EBL:  minimal   BLOOD ADMINISTERED:none  DRAINS: none   LOCAL MEDICATIONS USED:  BUPIVICAINE   SPECIMEN:  Source of Specimen: Right ilioinguinal nerve, encapsulated mesh  DISPOSITION OF SPECIMEN:  PATHOLOGY  COUNTS:  YES  TOURNIQUET:  * No tourniquets in log *  DICTATION: .Dragon Dictation  Indication procedure: Patient is a 53 year old male with a previous right inguinal hernia repair with mesh.  He had this done both an open procedure as well as recently done robotically.  Patient had significant pain to the right inguinal area which described as burning.  He was counseled and underwent conservative measures.  He continued with pain.  Decided to have open hernia repair with mesh and ilioinguinal removal.  Findings: Patient with large amount of scar tissue.  The ilioinguinal nerve was found.  This was seen going into a dense area of scar tissue.  This was dissected back in the ilioinguinal nerve was tied off and transected.  The cord structures were very thin but visualized in its entirety the vas deferens was identified and protected at all portions of the case.  A piece of ProGrip mesh was placed over the internal oblique.  This was sutured into place.  Ilioinguinal nerve and mesh that was encapsulate was sent to pathology.  Details of the procedure: The patient was taken back to the operating room. The patient was placed in supine position with bilateral SCDs in place. The patient was prepped and draped in the usual  sterile fashion.  After appropriate anitbiotics were confirmed, a time-out was confirmed and all facts were verified.  Quarter percent Marcaine  was used to infiltrate the area of the incision and an ilioinguinal nerve block was also placed.   A 5 cm incision was made just 1 cm superior to the inguinal ligament. Bovie cautery was used to maintain hemostasis dissection is carried down to the external oblique.  A standard incision was made laterally, and the external oblique was bluntly dissected away from the surrounding tissue with Metzenbaum scissors. The external oblique was elevated in the spermatic cord was bluntly dissected away from the surrounding tissue.  There is large amount of dense scar tissue.  This was incised.  At this time the ilioinguinal nerve could be seen going into this wad of scar tissue.  The ilioinguinal nerve was identified and dissected proximally, and ligated with an 2-0 vicryl.   There was some thick and encapsulated mesh as well as scar tissue as we dissected towards the pubic tubercle.  The spermatic cord and the hernia were then bluntly dissected away from the pubic tubercle and a Penrose was placed around the hernia sac in the spermatic cord. The vas deferens was identified and protected at all portions of the case.  There were no real cremasterics seen.  The blood vessels that went down to the testicle were visualized.  The cord itself seemed very thin in nature.  There was no hernia sac that was identified.  At this time a right-sided Progrip mesh was then anchored to the  pubic tubercle with a 2-0 Prolene.  It was anchored to the shelving edge of the external oblique x 1 and the conjoint tendon cephalad x 1.  The wrap around of the mesh was sutured to the conjoint tendon as well.  The new internal ring did not strangulate the spermatic cord.   The tail was then tucked under the external oblique. At this time the area was irrigated out with sterile saline.    The external  oblique was reapproximated using a 2-0 Vicryl in a running fashion. Scarpa's fascia was then reapproximated using a 3-0 Vicryl running fashion. The skin was then reapproximated with 4 Monocryl in a subcuticular fashion. The skin was then dressed with Dermabond.  The patient was taken to the recovery room in stable condition.   PLAN OF CARE: Discharge to home after PACU  PATIENT DISPOSITION:  PACU - hemodynamically stable.   Delay start of Pharmacological VTE agent (>24hrs) due to surgical blood loss or risk of bleeding: not applicable

## 2024-06-27 ENCOUNTER — Encounter (HOSPITAL_COMMUNITY): Payer: Self-pay | Admitting: General Surgery

## 2024-06-27 LAB — SURGICAL PATHOLOGY
